# Patient Record
Sex: Female | Born: 1940 | Race: White | Hispanic: No | Smoking: Former smoker
Health system: Southern US, Community
[De-identification: ages and names within clinical notes are randomized; demographics above are authoritative.]

## PROBLEM LIST (undated history)

## (undated) DIAGNOSIS — I209 Angina pectoris, unspecified: Secondary | ICD-10-CM

## (undated) DIAGNOSIS — R0600 Dyspnea, unspecified: Secondary | ICD-10-CM

## (undated) DIAGNOSIS — T4145XA Adverse effect of unspecified anesthetic, initial encounter: Secondary | ICD-10-CM

## (undated) DIAGNOSIS — J449 Chronic obstructive pulmonary disease, unspecified: Secondary | ICD-10-CM

## (undated) DIAGNOSIS — F32A Depression, unspecified: Secondary | ICD-10-CM

## (undated) DIAGNOSIS — C4490 Unspecified malignant neoplasm of skin, unspecified: Secondary | ICD-10-CM

## (undated) DIAGNOSIS — K219 Gastro-esophageal reflux disease without esophagitis: Secondary | ICD-10-CM

## (undated) DIAGNOSIS — E785 Hyperlipidemia, unspecified: Secondary | ICD-10-CM

## (undated) DIAGNOSIS — M1990 Unspecified osteoarthritis, unspecified site: Secondary | ICD-10-CM

## (undated) DIAGNOSIS — I10 Essential (primary) hypertension: Secondary | ICD-10-CM

## (undated) DIAGNOSIS — D649 Anemia, unspecified: Secondary | ICD-10-CM

## (undated) DIAGNOSIS — R519 Headache, unspecified: Secondary | ICD-10-CM

## (undated) DIAGNOSIS — R51 Headache: Secondary | ICD-10-CM

## (undated) DIAGNOSIS — E278 Other specified disorders of adrenal gland: Secondary | ICD-10-CM

## (undated) DIAGNOSIS — Z9889 Other specified postprocedural states: Secondary | ICD-10-CM

## (undated) DIAGNOSIS — E279 Disorder of adrenal gland, unspecified: Secondary | ICD-10-CM

## (undated) DIAGNOSIS — G4730 Sleep apnea, unspecified: Secondary | ICD-10-CM

## (undated) DIAGNOSIS — F329 Major depressive disorder, single episode, unspecified: Secondary | ICD-10-CM

## (undated) DIAGNOSIS — C801 Malignant (primary) neoplasm, unspecified: Secondary | ICD-10-CM

## (undated) DIAGNOSIS — R112 Nausea with vomiting, unspecified: Secondary | ICD-10-CM

## (undated) DIAGNOSIS — T8859XA Other complications of anesthesia, initial encounter: Secondary | ICD-10-CM

## (undated) HISTORY — PX: ROTATOR CUFF REPAIR: SHX139

## (undated) HISTORY — PX: EYE SURGERY: SHX253

## (undated) HISTORY — PX: FOOT SURGERY: SHX648

## (undated) HISTORY — PX: ADENOIDECTOMY: SUR15

## (undated) HISTORY — PX: FRACTURE SURGERY: SHX138

## (undated) HISTORY — PX: TONSILLECTOMY: SUR1361

## (undated) HISTORY — PX: ABDOMINAL HYSTERECTOMY: SHX81

---
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
## 2005-04-20 ENCOUNTER — Ambulatory Visit: Payer: Self-pay

## 2006-06-29 ENCOUNTER — Ambulatory Visit: Payer: Self-pay | Admitting: Unknown Physician Specialty

## 2006-07-30 ENCOUNTER — Ambulatory Visit: Payer: Self-pay | Admitting: Internal Medicine

## 2006-08-22 ENCOUNTER — Ambulatory Visit: Payer: Self-pay | Admitting: Unknown Physician Specialty

## 2006-09-18 ENCOUNTER — Other Ambulatory Visit: Payer: Self-pay

## 2006-09-18 ENCOUNTER — Ambulatory Visit: Payer: Self-pay | Admitting: Unknown Physician Specialty

## 2006-09-23 ENCOUNTER — Ambulatory Visit: Payer: Self-pay | Admitting: Unknown Physician Specialty

## 2007-04-12 ENCOUNTER — Ambulatory Visit: Payer: Self-pay | Admitting: Internal Medicine

## 2008-03-23 ENCOUNTER — Ambulatory Visit: Payer: Self-pay | Admitting: Internal Medicine

## 2008-07-06 ENCOUNTER — Ambulatory Visit: Payer: Self-pay | Admitting: Internal Medicine

## 2009-01-17 ENCOUNTER — Ambulatory Visit: Payer: Self-pay | Admitting: Internal Medicine

## 2009-01-18 ENCOUNTER — Ambulatory Visit: Payer: Self-pay | Admitting: Internal Medicine

## 2009-02-18 ENCOUNTER — Ambulatory Visit: Payer: Self-pay | Admitting: Internal Medicine

## 2009-02-21 ENCOUNTER — Ambulatory Visit: Payer: Self-pay

## 2009-03-07 ENCOUNTER — Ambulatory Visit: Payer: Self-pay

## 2009-03-21 ENCOUNTER — Ambulatory Visit: Payer: Self-pay | Admitting: Internal Medicine

## 2009-05-28 ENCOUNTER — Ambulatory Visit: Payer: Self-pay | Admitting: Internal Medicine

## 2009-10-04 ENCOUNTER — Ambulatory Visit: Payer: Self-pay | Admitting: Podiatry

## 2010-12-18 ENCOUNTER — Ambulatory Visit: Payer: Self-pay | Admitting: Internal Medicine

## 2011-10-20 ENCOUNTER — Ambulatory Visit: Payer: Self-pay | Admitting: Unknown Physician Specialty

## 2011-10-21 LAB — PATHOLOGY REPORT

## 2012-01-01 ENCOUNTER — Ambulatory Visit: Payer: Self-pay | Admitting: Internal Medicine

## 2012-01-13 ENCOUNTER — Ambulatory Visit: Payer: Self-pay | Admitting: Internal Medicine

## 2012-04-01 ENCOUNTER — Ambulatory Visit: Payer: Self-pay | Admitting: Physical Medicine and Rehabilitation

## 2012-04-28 ENCOUNTER — Ambulatory Visit: Payer: Self-pay | Admitting: Unknown Physician Specialty

## 2012-05-27 ENCOUNTER — Ambulatory Visit: Payer: Self-pay | Admitting: Unknown Physician Specialty

## 2012-05-31 LAB — PATHOLOGY REPORT

## 2012-09-27 ENCOUNTER — Ambulatory Visit: Payer: Self-pay

## 2012-09-27 LAB — CREATININE, SERUM
Creatinine: 0.97 mg/dL (ref 0.60–1.30)
EGFR (African American): >60

## 2013-01-14 ENCOUNTER — Encounter: Payer: Self-pay | Admitting: Neurosurgery

## 2013-02-17 ENCOUNTER — Ambulatory Visit: Payer: Self-pay | Admitting: Internal Medicine

## 2013-11-02 ENCOUNTER — Ambulatory Visit: Payer: Self-pay

## 2014-11-28 ENCOUNTER — Other Ambulatory Visit: Payer: Self-pay | Admitting: Internal Medicine

## 2014-11-28 DIAGNOSIS — E279 Disorder of adrenal gland, unspecified: Principal | ICD-10-CM

## 2014-11-28 DIAGNOSIS — E278 Other specified disorders of adrenal gland: Secondary | ICD-10-CM

## 2014-12-06 ENCOUNTER — Ambulatory Visit
Admission: RE | Admit: 2014-12-06 | Discharge: 2014-12-06 | Disposition: A | Discharge status: Home / Self Care | Payer: PPO | Source: Ambulatory Visit | Attending: Internal Medicine | Admitting: Internal Medicine

## 2014-12-06 DIAGNOSIS — D3501 Benign neoplasm of right adrenal gland: Secondary | ICD-10-CM | POA: Diagnosis not present

## 2014-12-06 DIAGNOSIS — E279 Disorder of adrenal gland, unspecified: Secondary | ICD-10-CM

## 2014-12-06 DIAGNOSIS — E278 Other specified disorders of adrenal gland: Secondary | ICD-10-CM

## 2015-08-22 DIAGNOSIS — X32XXXA Exposure to sunlight, initial encounter: Secondary | ICD-10-CM | POA: Diagnosis not present

## 2015-08-22 DIAGNOSIS — L538 Other specified erythematous conditions: Secondary | ICD-10-CM | POA: Diagnosis not present

## 2015-08-22 DIAGNOSIS — L821 Other seborrheic keratosis: Secondary | ICD-10-CM | POA: Diagnosis not present

## 2015-08-22 DIAGNOSIS — L570 Actinic keratosis: Secondary | ICD-10-CM | POA: Diagnosis not present

## 2015-08-22 DIAGNOSIS — L298 Other pruritus: Secondary | ICD-10-CM | POA: Diagnosis not present

## 2015-08-22 DIAGNOSIS — L820 Inflamed seborrheic keratosis: Secondary | ICD-10-CM | POA: Diagnosis not present

## 2015-08-22 DIAGNOSIS — D485 Neoplasm of uncertain behavior of skin: Secondary | ICD-10-CM | POA: Diagnosis not present

## 2015-08-22 DIAGNOSIS — Z85828 Personal history of other malignant neoplasm of skin: Secondary | ICD-10-CM | POA: Diagnosis not present

## 2015-08-22 DIAGNOSIS — D0439 Carcinoma in situ of skin of other parts of face: Secondary | ICD-10-CM | POA: Diagnosis not present

## 2015-08-31 DIAGNOSIS — R42 Dizziness and giddiness: Secondary | ICD-10-CM | POA: Diagnosis not present

## 2015-08-31 DIAGNOSIS — J310 Chronic rhinitis: Secondary | ICD-10-CM | POA: Diagnosis not present

## 2015-08-31 DIAGNOSIS — G4733 Obstructive sleep apnea (adult) (pediatric): Secondary | ICD-10-CM | POA: Diagnosis not present

## 2015-09-25 DIAGNOSIS — H2513 Age-related nuclear cataract, bilateral: Secondary | ICD-10-CM | POA: Diagnosis not present

## 2015-09-26 DIAGNOSIS — G4733 Obstructive sleep apnea (adult) (pediatric): Secondary | ICD-10-CM | POA: Diagnosis not present

## 2015-09-28 DIAGNOSIS — N8111 Cystocele, midline: Secondary | ICD-10-CM | POA: Diagnosis not present

## 2015-09-28 DIAGNOSIS — N3281 Overactive bladder: Secondary | ICD-10-CM | POA: Diagnosis not present

## 2015-10-27 DIAGNOSIS — G4733 Obstructive sleep apnea (adult) (pediatric): Secondary | ICD-10-CM | POA: Diagnosis not present

## 2015-10-31 DIAGNOSIS — G4733 Obstructive sleep apnea (adult) (pediatric): Secondary | ICD-10-CM | POA: Diagnosis not present

## 2015-11-07 DIAGNOSIS — M5136 Other intervertebral disc degeneration, lumbar region: Secondary | ICD-10-CM | POA: Diagnosis not present

## 2015-11-07 DIAGNOSIS — M5134 Other intervertebral disc degeneration, thoracic region: Secondary | ICD-10-CM | POA: Diagnosis not present

## 2015-11-07 DIAGNOSIS — M546 Pain in thoracic spine: Secondary | ICD-10-CM | POA: Diagnosis not present

## 2015-11-07 DIAGNOSIS — M542 Cervicalgia: Secondary | ICD-10-CM | POA: Diagnosis not present

## 2015-11-07 DIAGNOSIS — M9901 Segmental and somatic dysfunction of cervical region: Secondary | ICD-10-CM | POA: Diagnosis not present

## 2015-11-07 DIAGNOSIS — M5030 Other cervical disc degeneration, unspecified cervical region: Secondary | ICD-10-CM | POA: Diagnosis not present

## 2015-11-07 DIAGNOSIS — M545 Low back pain: Secondary | ICD-10-CM | POA: Diagnosis not present

## 2015-11-07 DIAGNOSIS — M9902 Segmental and somatic dysfunction of thoracic region: Secondary | ICD-10-CM | POA: Diagnosis not present

## 2015-11-07 DIAGNOSIS — M9903 Segmental and somatic dysfunction of lumbar region: Secondary | ICD-10-CM | POA: Diagnosis not present

## 2015-11-09 DIAGNOSIS — M5134 Other intervertebral disc degeneration, thoracic region: Secondary | ICD-10-CM | POA: Diagnosis not present

## 2015-11-09 DIAGNOSIS — M5030 Other cervical disc degeneration, unspecified cervical region: Secondary | ICD-10-CM | POA: Diagnosis not present

## 2015-11-09 DIAGNOSIS — M5136 Other intervertebral disc degeneration, lumbar region: Secondary | ICD-10-CM | POA: Diagnosis not present

## 2015-11-09 DIAGNOSIS — M546 Pain in thoracic spine: Secondary | ICD-10-CM | POA: Diagnosis not present

## 2015-11-09 DIAGNOSIS — M9903 Segmental and somatic dysfunction of lumbar region: Secondary | ICD-10-CM | POA: Diagnosis not present

## 2015-11-09 DIAGNOSIS — M9901 Segmental and somatic dysfunction of cervical region: Secondary | ICD-10-CM | POA: Diagnosis not present

## 2015-11-09 DIAGNOSIS — M545 Low back pain: Secondary | ICD-10-CM | POA: Diagnosis not present

## 2015-11-09 DIAGNOSIS — M542 Cervicalgia: Secondary | ICD-10-CM | POA: Diagnosis not present

## 2015-11-09 DIAGNOSIS — M9902 Segmental and somatic dysfunction of thoracic region: Secondary | ICD-10-CM | POA: Diagnosis not present

## 2015-11-12 DIAGNOSIS — M9901 Segmental and somatic dysfunction of cervical region: Secondary | ICD-10-CM | POA: Diagnosis not present

## 2015-11-12 DIAGNOSIS — M545 Low back pain: Secondary | ICD-10-CM | POA: Diagnosis not present

## 2015-11-12 DIAGNOSIS — M9903 Segmental and somatic dysfunction of lumbar region: Secondary | ICD-10-CM | POA: Diagnosis not present

## 2015-11-12 DIAGNOSIS — M5030 Other cervical disc degeneration, unspecified cervical region: Secondary | ICD-10-CM | POA: Diagnosis not present

## 2015-11-12 DIAGNOSIS — M5134 Other intervertebral disc degeneration, thoracic region: Secondary | ICD-10-CM | POA: Diagnosis not present

## 2015-11-12 DIAGNOSIS — M9902 Segmental and somatic dysfunction of thoracic region: Secondary | ICD-10-CM | POA: Diagnosis not present

## 2015-11-12 DIAGNOSIS — M546 Pain in thoracic spine: Secondary | ICD-10-CM | POA: Diagnosis not present

## 2015-11-12 DIAGNOSIS — M542 Cervicalgia: Secondary | ICD-10-CM | POA: Diagnosis not present

## 2015-11-12 DIAGNOSIS — M5136 Other intervertebral disc degeneration, lumbar region: Secondary | ICD-10-CM | POA: Diagnosis not present

## 2015-11-14 DIAGNOSIS — M5134 Other intervertebral disc degeneration, thoracic region: Secondary | ICD-10-CM | POA: Diagnosis not present

## 2015-11-14 DIAGNOSIS — M545 Low back pain: Secondary | ICD-10-CM | POA: Diagnosis not present

## 2015-11-14 DIAGNOSIS — M9903 Segmental and somatic dysfunction of lumbar region: Secondary | ICD-10-CM | POA: Diagnosis not present

## 2015-11-14 DIAGNOSIS — M5030 Other cervical disc degeneration, unspecified cervical region: Secondary | ICD-10-CM | POA: Diagnosis not present

## 2015-11-14 DIAGNOSIS — M9901 Segmental and somatic dysfunction of cervical region: Secondary | ICD-10-CM | POA: Diagnosis not present

## 2015-11-14 DIAGNOSIS — M546 Pain in thoracic spine: Secondary | ICD-10-CM | POA: Diagnosis not present

## 2015-11-14 DIAGNOSIS — I10 Essential (primary) hypertension: Secondary | ICD-10-CM | POA: Diagnosis not present

## 2015-11-14 DIAGNOSIS — M542 Cervicalgia: Secondary | ICD-10-CM | POA: Diagnosis not present

## 2015-11-14 DIAGNOSIS — F329 Major depressive disorder, single episode, unspecified: Secondary | ICD-10-CM | POA: Diagnosis not present

## 2015-11-14 DIAGNOSIS — M9902 Segmental and somatic dysfunction of thoracic region: Secondary | ICD-10-CM | POA: Diagnosis not present

## 2015-11-14 DIAGNOSIS — M5136 Other intervertebral disc degeneration, lumbar region: Secondary | ICD-10-CM | POA: Diagnosis not present

## 2015-11-14 DIAGNOSIS — K219 Gastro-esophageal reflux disease without esophagitis: Secondary | ICD-10-CM | POA: Diagnosis not present

## 2015-11-14 DIAGNOSIS — M79644 Pain in right finger(s): Secondary | ICD-10-CM | POA: Diagnosis not present

## 2015-11-14 DIAGNOSIS — G4733 Obstructive sleep apnea (adult) (pediatric): Secondary | ICD-10-CM | POA: Diagnosis not present

## 2015-11-14 DIAGNOSIS — M159 Polyosteoarthritis, unspecified: Secondary | ICD-10-CM | POA: Diagnosis not present

## 2015-11-15 DIAGNOSIS — G4733 Obstructive sleep apnea (adult) (pediatric): Secondary | ICD-10-CM | POA: Diagnosis not present

## 2015-11-15 DIAGNOSIS — K219 Gastro-esophageal reflux disease without esophagitis: Secondary | ICD-10-CM | POA: Diagnosis not present

## 2015-11-15 DIAGNOSIS — M159 Polyosteoarthritis, unspecified: Secondary | ICD-10-CM | POA: Diagnosis not present

## 2015-11-15 DIAGNOSIS — F329 Major depressive disorder, single episode, unspecified: Secondary | ICD-10-CM | POA: Diagnosis not present

## 2015-11-15 DIAGNOSIS — I10 Essential (primary) hypertension: Secondary | ICD-10-CM | POA: Diagnosis not present

## 2015-11-15 DIAGNOSIS — M79644 Pain in right finger(s): Secondary | ICD-10-CM | POA: Diagnosis not present

## 2015-11-16 DIAGNOSIS — M5136 Other intervertebral disc degeneration, lumbar region: Secondary | ICD-10-CM | POA: Diagnosis not present

## 2015-11-16 DIAGNOSIS — M9903 Segmental and somatic dysfunction of lumbar region: Secondary | ICD-10-CM | POA: Diagnosis not present

## 2015-11-16 DIAGNOSIS — M5030 Other cervical disc degeneration, unspecified cervical region: Secondary | ICD-10-CM | POA: Diagnosis not present

## 2015-11-16 DIAGNOSIS — M545 Low back pain: Secondary | ICD-10-CM | POA: Diagnosis not present

## 2015-11-16 DIAGNOSIS — M546 Pain in thoracic spine: Secondary | ICD-10-CM | POA: Diagnosis not present

## 2015-11-16 DIAGNOSIS — M5134 Other intervertebral disc degeneration, thoracic region: Secondary | ICD-10-CM | POA: Diagnosis not present

## 2015-11-16 DIAGNOSIS — M542 Cervicalgia: Secondary | ICD-10-CM | POA: Diagnosis not present

## 2015-11-16 DIAGNOSIS — M9901 Segmental and somatic dysfunction of cervical region: Secondary | ICD-10-CM | POA: Diagnosis not present

## 2015-11-16 DIAGNOSIS — M9902 Segmental and somatic dysfunction of thoracic region: Secondary | ICD-10-CM | POA: Diagnosis not present

## 2015-11-19 DIAGNOSIS — M542 Cervicalgia: Secondary | ICD-10-CM | POA: Diagnosis not present

## 2015-11-19 DIAGNOSIS — M5136 Other intervertebral disc degeneration, lumbar region: Secondary | ICD-10-CM | POA: Diagnosis not present

## 2015-11-19 DIAGNOSIS — M545 Low back pain: Secondary | ICD-10-CM | POA: Diagnosis not present

## 2015-11-19 DIAGNOSIS — M9902 Segmental and somatic dysfunction of thoracic region: Secondary | ICD-10-CM | POA: Diagnosis not present

## 2015-11-19 DIAGNOSIS — M5030 Other cervical disc degeneration, unspecified cervical region: Secondary | ICD-10-CM | POA: Diagnosis not present

## 2015-11-19 DIAGNOSIS — M546 Pain in thoracic spine: Secondary | ICD-10-CM | POA: Diagnosis not present

## 2015-11-19 DIAGNOSIS — M9901 Segmental and somatic dysfunction of cervical region: Secondary | ICD-10-CM | POA: Diagnosis not present

## 2015-11-19 DIAGNOSIS — M9903 Segmental and somatic dysfunction of lumbar region: Secondary | ICD-10-CM | POA: Diagnosis not present

## 2015-11-19 DIAGNOSIS — M5134 Other intervertebral disc degeneration, thoracic region: Secondary | ICD-10-CM | POA: Diagnosis not present

## 2015-11-20 DIAGNOSIS — G4733 Obstructive sleep apnea (adult) (pediatric): Secondary | ICD-10-CM | POA: Diagnosis not present

## 2015-11-20 DIAGNOSIS — J310 Chronic rhinitis: Secondary | ICD-10-CM | POA: Diagnosis not present

## 2015-11-21 ENCOUNTER — Other Ambulatory Visit: Payer: Self-pay | Admitting: Internal Medicine

## 2015-11-21 DIAGNOSIS — M9901 Segmental and somatic dysfunction of cervical region: Secondary | ICD-10-CM | POA: Diagnosis not present

## 2015-11-21 DIAGNOSIS — M546 Pain in thoracic spine: Secondary | ICD-10-CM | POA: Diagnosis not present

## 2015-11-21 DIAGNOSIS — Z1239 Encounter for other screening for malignant neoplasm of breast: Secondary | ICD-10-CM | POA: Diagnosis not present

## 2015-11-21 DIAGNOSIS — Z87898 Personal history of other specified conditions: Secondary | ICD-10-CM | POA: Diagnosis not present

## 2015-11-21 DIAGNOSIS — I10 Essential (primary) hypertension: Secondary | ICD-10-CM | POA: Diagnosis not present

## 2015-11-21 DIAGNOSIS — Z1231 Encounter for screening mammogram for malignant neoplasm of breast: Secondary | ICD-10-CM

## 2015-11-21 DIAGNOSIS — F329 Major depressive disorder, single episode, unspecified: Secondary | ICD-10-CM | POA: Diagnosis not present

## 2015-11-21 DIAGNOSIS — K219 Gastro-esophageal reflux disease without esophagitis: Secondary | ICD-10-CM | POA: Diagnosis not present

## 2015-11-21 DIAGNOSIS — M5134 Other intervertebral disc degeneration, thoracic region: Secondary | ICD-10-CM | POA: Diagnosis not present

## 2015-11-21 DIAGNOSIS — M9903 Segmental and somatic dysfunction of lumbar region: Secondary | ICD-10-CM | POA: Diagnosis not present

## 2015-11-21 DIAGNOSIS — M545 Low back pain: Secondary | ICD-10-CM | POA: Diagnosis not present

## 2015-11-21 DIAGNOSIS — M5136 Other intervertebral disc degeneration, lumbar region: Secondary | ICD-10-CM | POA: Diagnosis not present

## 2015-11-21 DIAGNOSIS — M542 Cervicalgia: Secondary | ICD-10-CM | POA: Diagnosis not present

## 2015-11-21 DIAGNOSIS — M9902 Segmental and somatic dysfunction of thoracic region: Secondary | ICD-10-CM | POA: Diagnosis not present

## 2015-11-21 DIAGNOSIS — M5030 Other cervical disc degeneration, unspecified cervical region: Secondary | ICD-10-CM | POA: Diagnosis not present

## 2015-11-23 DIAGNOSIS — M545 Low back pain: Secondary | ICD-10-CM | POA: Diagnosis not present

## 2015-11-23 DIAGNOSIS — M5134 Other intervertebral disc degeneration, thoracic region: Secondary | ICD-10-CM | POA: Diagnosis not present

## 2015-11-23 DIAGNOSIS — M5136 Other intervertebral disc degeneration, lumbar region: Secondary | ICD-10-CM | POA: Diagnosis not present

## 2015-11-23 DIAGNOSIS — M9903 Segmental and somatic dysfunction of lumbar region: Secondary | ICD-10-CM | POA: Diagnosis not present

## 2015-11-23 DIAGNOSIS — M546 Pain in thoracic spine: Secondary | ICD-10-CM | POA: Diagnosis not present

## 2015-11-23 DIAGNOSIS — M9901 Segmental and somatic dysfunction of cervical region: Secondary | ICD-10-CM | POA: Diagnosis not present

## 2015-11-23 DIAGNOSIS — M542 Cervicalgia: Secondary | ICD-10-CM | POA: Diagnosis not present

## 2015-11-23 DIAGNOSIS — M9902 Segmental and somatic dysfunction of thoracic region: Secondary | ICD-10-CM | POA: Diagnosis not present

## 2015-11-23 DIAGNOSIS — M5030 Other cervical disc degeneration, unspecified cervical region: Secondary | ICD-10-CM | POA: Diagnosis not present

## 2015-11-26 DIAGNOSIS — G4733 Obstructive sleep apnea (adult) (pediatric): Secondary | ICD-10-CM | POA: Diagnosis not present

## 2015-11-29 ENCOUNTER — Ambulatory Visit: Payer: PPO

## 2015-12-10 DIAGNOSIS — M546 Pain in thoracic spine: Secondary | ICD-10-CM | POA: Diagnosis not present

## 2015-12-10 DIAGNOSIS — M542 Cervicalgia: Secondary | ICD-10-CM | POA: Diagnosis not present

## 2015-12-10 DIAGNOSIS — M5136 Other intervertebral disc degeneration, lumbar region: Secondary | ICD-10-CM | POA: Diagnosis not present

## 2015-12-10 DIAGNOSIS — M5030 Other cervical disc degeneration, unspecified cervical region: Secondary | ICD-10-CM | POA: Diagnosis not present

## 2015-12-10 DIAGNOSIS — M545 Low back pain: Secondary | ICD-10-CM | POA: Diagnosis not present

## 2015-12-10 DIAGNOSIS — M9902 Segmental and somatic dysfunction of thoracic region: Secondary | ICD-10-CM | POA: Diagnosis not present

## 2015-12-10 DIAGNOSIS — M5134 Other intervertebral disc degeneration, thoracic region: Secondary | ICD-10-CM | POA: Diagnosis not present

## 2015-12-10 DIAGNOSIS — M9901 Segmental and somatic dysfunction of cervical region: Secondary | ICD-10-CM | POA: Diagnosis not present

## 2015-12-10 DIAGNOSIS — M9903 Segmental and somatic dysfunction of lumbar region: Secondary | ICD-10-CM | POA: Diagnosis not present

## 2015-12-12 DIAGNOSIS — M5030 Other cervical disc degeneration, unspecified cervical region: Secondary | ICD-10-CM | POA: Diagnosis not present

## 2015-12-12 DIAGNOSIS — M546 Pain in thoracic spine: Secondary | ICD-10-CM | POA: Diagnosis not present

## 2015-12-12 DIAGNOSIS — M9901 Segmental and somatic dysfunction of cervical region: Secondary | ICD-10-CM | POA: Diagnosis not present

## 2015-12-12 DIAGNOSIS — M542 Cervicalgia: Secondary | ICD-10-CM | POA: Diagnosis not present

## 2015-12-12 DIAGNOSIS — M9902 Segmental and somatic dysfunction of thoracic region: Secondary | ICD-10-CM | POA: Diagnosis not present

## 2015-12-12 DIAGNOSIS — M5134 Other intervertebral disc degeneration, thoracic region: Secondary | ICD-10-CM | POA: Diagnosis not present

## 2015-12-12 DIAGNOSIS — M545 Low back pain: Secondary | ICD-10-CM | POA: Diagnosis not present

## 2015-12-12 DIAGNOSIS — M9903 Segmental and somatic dysfunction of lumbar region: Secondary | ICD-10-CM | POA: Diagnosis not present

## 2015-12-12 DIAGNOSIS — M5136 Other intervertebral disc degeneration, lumbar region: Secondary | ICD-10-CM | POA: Diagnosis not present

## 2015-12-14 DIAGNOSIS — M9902 Segmental and somatic dysfunction of thoracic region: Secondary | ICD-10-CM | POA: Diagnosis not present

## 2015-12-14 DIAGNOSIS — M5134 Other intervertebral disc degeneration, thoracic region: Secondary | ICD-10-CM | POA: Diagnosis not present

## 2015-12-14 DIAGNOSIS — M9901 Segmental and somatic dysfunction of cervical region: Secondary | ICD-10-CM | POA: Diagnosis not present

## 2015-12-14 DIAGNOSIS — M9903 Segmental and somatic dysfunction of lumbar region: Secondary | ICD-10-CM | POA: Diagnosis not present

## 2015-12-14 DIAGNOSIS — M542 Cervicalgia: Secondary | ICD-10-CM | POA: Diagnosis not present

## 2015-12-14 DIAGNOSIS — M5030 Other cervical disc degeneration, unspecified cervical region: Secondary | ICD-10-CM | POA: Diagnosis not present

## 2015-12-14 DIAGNOSIS — M5136 Other intervertebral disc degeneration, lumbar region: Secondary | ICD-10-CM | POA: Diagnosis not present

## 2015-12-14 DIAGNOSIS — M545 Low back pain: Secondary | ICD-10-CM | POA: Diagnosis not present

## 2015-12-14 DIAGNOSIS — M546 Pain in thoracic spine: Secondary | ICD-10-CM | POA: Diagnosis not present

## 2015-12-18 ENCOUNTER — Ambulatory Visit: Admission: RE | Admit: 2015-12-18 | Payer: PPO | Source: Ambulatory Visit

## 2015-12-19 DIAGNOSIS — M9901 Segmental and somatic dysfunction of cervical region: Secondary | ICD-10-CM | POA: Diagnosis not present

## 2015-12-19 DIAGNOSIS — M542 Cervicalgia: Secondary | ICD-10-CM | POA: Diagnosis not present

## 2015-12-19 DIAGNOSIS — M9903 Segmental and somatic dysfunction of lumbar region: Secondary | ICD-10-CM | POA: Diagnosis not present

## 2015-12-19 DIAGNOSIS — M9902 Segmental and somatic dysfunction of thoracic region: Secondary | ICD-10-CM | POA: Diagnosis not present

## 2015-12-19 DIAGNOSIS — M5134 Other intervertebral disc degeneration, thoracic region: Secondary | ICD-10-CM | POA: Diagnosis not present

## 2015-12-19 DIAGNOSIS — M546 Pain in thoracic spine: Secondary | ICD-10-CM | POA: Diagnosis not present

## 2015-12-19 DIAGNOSIS — M5030 Other cervical disc degeneration, unspecified cervical region: Secondary | ICD-10-CM | POA: Diagnosis not present

## 2015-12-19 DIAGNOSIS — M5136 Other intervertebral disc degeneration, lumbar region: Secondary | ICD-10-CM | POA: Diagnosis not present

## 2015-12-19 DIAGNOSIS — M545 Low back pain: Secondary | ICD-10-CM | POA: Diagnosis not present

## 2015-12-20 ENCOUNTER — Ambulatory Visit
Admission: RE | Admit: 2015-12-20 | Discharge: 2015-12-20 | Disposition: A | Discharge status: Home / Self Care | Payer: PPO | Source: Ambulatory Visit | Attending: Internal Medicine | Admitting: Internal Medicine

## 2015-12-20 DIAGNOSIS — Z1231 Encounter for screening mammogram for malignant neoplasm of breast: Secondary | ICD-10-CM

## 2015-12-20 HISTORY — DX: Unspecified malignant neoplasm of skin, unspecified: C44.90

## 2015-12-20 HISTORY — DX: Malignant (primary) neoplasm, unspecified: C80.1

## 2015-12-24 ENCOUNTER — Other Ambulatory Visit: Payer: Self-pay | Admitting: Internal Medicine

## 2015-12-24 DIAGNOSIS — R928 Other abnormal and inconclusive findings on diagnostic imaging of breast: Secondary | ICD-10-CM

## 2015-12-25 DIAGNOSIS — M9901 Segmental and somatic dysfunction of cervical region: Secondary | ICD-10-CM | POA: Diagnosis not present

## 2015-12-25 DIAGNOSIS — M546 Pain in thoracic spine: Secondary | ICD-10-CM | POA: Diagnosis not present

## 2015-12-25 DIAGNOSIS — M9902 Segmental and somatic dysfunction of thoracic region: Secondary | ICD-10-CM | POA: Diagnosis not present

## 2015-12-25 DIAGNOSIS — M542 Cervicalgia: Secondary | ICD-10-CM | POA: Diagnosis not present

## 2015-12-25 DIAGNOSIS — M5136 Other intervertebral disc degeneration, lumbar region: Secondary | ICD-10-CM | POA: Diagnosis not present

## 2015-12-25 DIAGNOSIS — M545 Low back pain: Secondary | ICD-10-CM | POA: Diagnosis not present

## 2015-12-25 DIAGNOSIS — M5134 Other intervertebral disc degeneration, thoracic region: Secondary | ICD-10-CM | POA: Diagnosis not present

## 2015-12-25 DIAGNOSIS — M9903 Segmental and somatic dysfunction of lumbar region: Secondary | ICD-10-CM | POA: Diagnosis not present

## 2015-12-25 DIAGNOSIS — M5030 Other cervical disc degeneration, unspecified cervical region: Secondary | ICD-10-CM | POA: Diagnosis not present

## 2015-12-27 DIAGNOSIS — G4733 Obstructive sleep apnea (adult) (pediatric): Secondary | ICD-10-CM | POA: Diagnosis not present

## 2015-12-28 ENCOUNTER — Ambulatory Visit
Admission: RE | Admit: 2015-12-28 | Discharge: 2015-12-28 | Disposition: A | Discharge status: Home / Self Care | Payer: PPO | Source: Ambulatory Visit | Attending: Internal Medicine | Admitting: Internal Medicine

## 2015-12-28 DIAGNOSIS — R928 Other abnormal and inconclusive findings on diagnostic imaging of breast: Secondary | ICD-10-CM

## 2015-12-28 DIAGNOSIS — M9903 Segmental and somatic dysfunction of lumbar region: Secondary | ICD-10-CM | POA: Diagnosis not present

## 2015-12-28 DIAGNOSIS — M545 Low back pain: Secondary | ICD-10-CM | POA: Diagnosis not present

## 2015-12-28 DIAGNOSIS — M5136 Other intervertebral disc degeneration, lumbar region: Secondary | ICD-10-CM | POA: Diagnosis not present

## 2015-12-28 DIAGNOSIS — R921 Mammographic calcification found on diagnostic imaging of breast: Secondary | ICD-10-CM | POA: Insufficient documentation

## 2015-12-28 DIAGNOSIS — M542 Cervicalgia: Secondary | ICD-10-CM | POA: Diagnosis not present

## 2015-12-28 DIAGNOSIS — M9902 Segmental and somatic dysfunction of thoracic region: Secondary | ICD-10-CM | POA: Diagnosis not present

## 2015-12-28 DIAGNOSIS — M9901 Segmental and somatic dysfunction of cervical region: Secondary | ICD-10-CM | POA: Diagnosis not present

## 2015-12-28 DIAGNOSIS — M5030 Other cervical disc degeneration, unspecified cervical region: Secondary | ICD-10-CM | POA: Diagnosis not present

## 2015-12-28 DIAGNOSIS — M5134 Other intervertebral disc degeneration, thoracic region: Secondary | ICD-10-CM | POA: Diagnosis not present

## 2015-12-28 DIAGNOSIS — M546 Pain in thoracic spine: Secondary | ICD-10-CM | POA: Diagnosis not present

## 2015-12-31 DIAGNOSIS — M546 Pain in thoracic spine: Secondary | ICD-10-CM | POA: Diagnosis not present

## 2015-12-31 DIAGNOSIS — M5134 Other intervertebral disc degeneration, thoracic region: Secondary | ICD-10-CM | POA: Diagnosis not present

## 2015-12-31 DIAGNOSIS — M545 Low back pain: Secondary | ICD-10-CM | POA: Diagnosis not present

## 2015-12-31 DIAGNOSIS — M9902 Segmental and somatic dysfunction of thoracic region: Secondary | ICD-10-CM | POA: Diagnosis not present

## 2015-12-31 DIAGNOSIS — M5030 Other cervical disc degeneration, unspecified cervical region: Secondary | ICD-10-CM | POA: Diagnosis not present

## 2015-12-31 DIAGNOSIS — M542 Cervicalgia: Secondary | ICD-10-CM | POA: Diagnosis not present

## 2015-12-31 DIAGNOSIS — M5136 Other intervertebral disc degeneration, lumbar region: Secondary | ICD-10-CM | POA: Diagnosis not present

## 2015-12-31 DIAGNOSIS — M9903 Segmental and somatic dysfunction of lumbar region: Secondary | ICD-10-CM | POA: Diagnosis not present

## 2015-12-31 DIAGNOSIS — M9901 Segmental and somatic dysfunction of cervical region: Secondary | ICD-10-CM | POA: Diagnosis not present

## 2016-01-01 ENCOUNTER — Other Ambulatory Visit: Payer: Self-pay | Admitting: Internal Medicine

## 2016-01-01 DIAGNOSIS — R921 Mammographic calcification found on diagnostic imaging of breast: Secondary | ICD-10-CM

## 2016-01-03 DIAGNOSIS — M545 Low back pain: Secondary | ICD-10-CM | POA: Diagnosis not present

## 2016-01-03 DIAGNOSIS — M9901 Segmental and somatic dysfunction of cervical region: Secondary | ICD-10-CM | POA: Diagnosis not present

## 2016-01-03 DIAGNOSIS — M546 Pain in thoracic spine: Secondary | ICD-10-CM | POA: Diagnosis not present

## 2016-01-03 DIAGNOSIS — M9902 Segmental and somatic dysfunction of thoracic region: Secondary | ICD-10-CM | POA: Diagnosis not present

## 2016-01-03 DIAGNOSIS — M5030 Other cervical disc degeneration, unspecified cervical region: Secondary | ICD-10-CM | POA: Diagnosis not present

## 2016-01-03 DIAGNOSIS — M542 Cervicalgia: Secondary | ICD-10-CM | POA: Diagnosis not present

## 2016-01-03 DIAGNOSIS — M9903 Segmental and somatic dysfunction of lumbar region: Secondary | ICD-10-CM | POA: Diagnosis not present

## 2016-01-03 DIAGNOSIS — M5134 Other intervertebral disc degeneration, thoracic region: Secondary | ICD-10-CM | POA: Diagnosis not present

## 2016-01-03 DIAGNOSIS — M5136 Other intervertebral disc degeneration, lumbar region: Secondary | ICD-10-CM | POA: Diagnosis not present

## 2016-01-04 ENCOUNTER — Ambulatory Visit
Admission: RE | Admit: 2016-01-04 | Discharge: 2016-01-04 | Disposition: A | Discharge status: Home / Self Care | Payer: PPO | Source: Ambulatory Visit | Attending: Internal Medicine | Admitting: Internal Medicine

## 2016-01-04 DIAGNOSIS — R921 Mammographic calcification found on diagnostic imaging of breast: Secondary | ICD-10-CM

## 2016-01-04 HISTORY — PX: BREAST BIOPSY: SHX20

## 2016-01-07 DIAGNOSIS — G4733 Obstructive sleep apnea (adult) (pediatric): Secondary | ICD-10-CM | POA: Diagnosis not present

## 2016-01-07 LAB — SURGICAL PATHOLOGY

## 2016-01-10 DIAGNOSIS — M9901 Segmental and somatic dysfunction of cervical region: Secondary | ICD-10-CM | POA: Diagnosis not present

## 2016-01-10 DIAGNOSIS — M5134 Other intervertebral disc degeneration, thoracic region: Secondary | ICD-10-CM | POA: Diagnosis not present

## 2016-01-10 DIAGNOSIS — M5136 Other intervertebral disc degeneration, lumbar region: Secondary | ICD-10-CM | POA: Diagnosis not present

## 2016-01-10 DIAGNOSIS — M546 Pain in thoracic spine: Secondary | ICD-10-CM | POA: Diagnosis not present

## 2016-01-10 DIAGNOSIS — M9902 Segmental and somatic dysfunction of thoracic region: Secondary | ICD-10-CM | POA: Diagnosis not present

## 2016-01-10 DIAGNOSIS — M542 Cervicalgia: Secondary | ICD-10-CM | POA: Diagnosis not present

## 2016-01-10 DIAGNOSIS — M9903 Segmental and somatic dysfunction of lumbar region: Secondary | ICD-10-CM | POA: Diagnosis not present

## 2016-01-10 DIAGNOSIS — M545 Low back pain: Secondary | ICD-10-CM | POA: Diagnosis not present

## 2016-01-10 DIAGNOSIS — M5030 Other cervical disc degeneration, unspecified cervical region: Secondary | ICD-10-CM | POA: Diagnosis not present

## 2016-01-17 DIAGNOSIS — M9902 Segmental and somatic dysfunction of thoracic region: Secondary | ICD-10-CM | POA: Diagnosis not present

## 2016-01-17 DIAGNOSIS — M546 Pain in thoracic spine: Secondary | ICD-10-CM | POA: Diagnosis not present

## 2016-01-17 DIAGNOSIS — M5134 Other intervertebral disc degeneration, thoracic region: Secondary | ICD-10-CM | POA: Diagnosis not present

## 2016-01-17 DIAGNOSIS — M9903 Segmental and somatic dysfunction of lumbar region: Secondary | ICD-10-CM | POA: Diagnosis not present

## 2016-01-17 DIAGNOSIS — M542 Cervicalgia: Secondary | ICD-10-CM | POA: Diagnosis not present

## 2016-01-17 DIAGNOSIS — M9901 Segmental and somatic dysfunction of cervical region: Secondary | ICD-10-CM | POA: Diagnosis not present

## 2016-01-17 DIAGNOSIS — M5030 Other cervical disc degeneration, unspecified cervical region: Secondary | ICD-10-CM | POA: Diagnosis not present

## 2016-01-17 DIAGNOSIS — M545 Low back pain: Secondary | ICD-10-CM | POA: Diagnosis not present

## 2016-01-17 DIAGNOSIS — M5136 Other intervertebral disc degeneration, lumbar region: Secondary | ICD-10-CM | POA: Diagnosis not present

## 2016-01-26 DIAGNOSIS — G4733 Obstructive sleep apnea (adult) (pediatric): Secondary | ICD-10-CM | POA: Diagnosis not present

## 2016-02-26 DIAGNOSIS — G4733 Obstructive sleep apnea (adult) (pediatric): Secondary | ICD-10-CM | POA: Diagnosis not present

## 2016-03-28 DIAGNOSIS — G4733 Obstructive sleep apnea (adult) (pediatric): Secondary | ICD-10-CM | POA: Diagnosis not present

## 2016-04-21 DIAGNOSIS — G4733 Obstructive sleep apnea (adult) (pediatric): Secondary | ICD-10-CM | POA: Diagnosis not present

## 2016-04-27 DIAGNOSIS — G4733 Obstructive sleep apnea (adult) (pediatric): Secondary | ICD-10-CM | POA: Diagnosis not present

## 2016-05-02 DIAGNOSIS — G4733 Obstructive sleep apnea (adult) (pediatric): Secondary | ICD-10-CM | POA: Diagnosis not present

## 2016-05-21 DIAGNOSIS — G4733 Obstructive sleep apnea (adult) (pediatric): Secondary | ICD-10-CM | POA: Diagnosis not present

## 2016-05-28 DIAGNOSIS — G4733 Obstructive sleep apnea (adult) (pediatric): Secondary | ICD-10-CM | POA: Diagnosis not present

## 2016-06-27 DIAGNOSIS — F329 Major depressive disorder, single episode, unspecified: Secondary | ICD-10-CM | POA: Diagnosis not present

## 2016-06-27 DIAGNOSIS — Z1231 Encounter for screening mammogram for malignant neoplasm of breast: Secondary | ICD-10-CM | POA: Diagnosis not present

## 2016-06-27 DIAGNOSIS — I10 Essential (primary) hypertension: Secondary | ICD-10-CM | POA: Diagnosis not present

## 2016-06-27 DIAGNOSIS — G4733 Obstructive sleep apnea (adult) (pediatric): Secondary | ICD-10-CM | POA: Diagnosis not present

## 2016-06-27 DIAGNOSIS — K219 Gastro-esophageal reflux disease without esophagitis: Secondary | ICD-10-CM | POA: Diagnosis not present

## 2016-07-04 DIAGNOSIS — R079 Chest pain, unspecified: Secondary | ICD-10-CM | POA: Diagnosis not present

## 2016-07-04 DIAGNOSIS — Z0000 Encounter for general adult medical examination without abnormal findings: Secondary | ICD-10-CM | POA: Diagnosis not present

## 2016-07-04 DIAGNOSIS — R002 Palpitations: Secondary | ICD-10-CM | POA: Diagnosis not present

## 2016-07-04 DIAGNOSIS — K219 Gastro-esophageal reflux disease without esophagitis: Secondary | ICD-10-CM | POA: Diagnosis not present

## 2016-07-04 DIAGNOSIS — R232 Flushing: Secondary | ICD-10-CM | POA: Diagnosis not present

## 2016-07-04 DIAGNOSIS — I10 Essential (primary) hypertension: Secondary | ICD-10-CM | POA: Diagnosis not present

## 2016-07-04 DIAGNOSIS — G4733 Obstructive sleep apnea (adult) (pediatric): Secondary | ICD-10-CM | POA: Diagnosis not present

## 2016-07-22 IMAGING — MG MM DIGITAL SCREENING BILAT W/ CAD
1 series · 4 of 4 positions shown · non-contrast
Comparison: Previous exam(s).

ACR Breast Density Category a: The breast tissue is almost entirely
fatty.

CLINICAL DATA: Screening.

EXAM:
DIGITAL SCREENING BILATERAL MAMMOGRAM WITH CAD

[R CC · right · 4 of 4 slices shown]
[im 1/4]
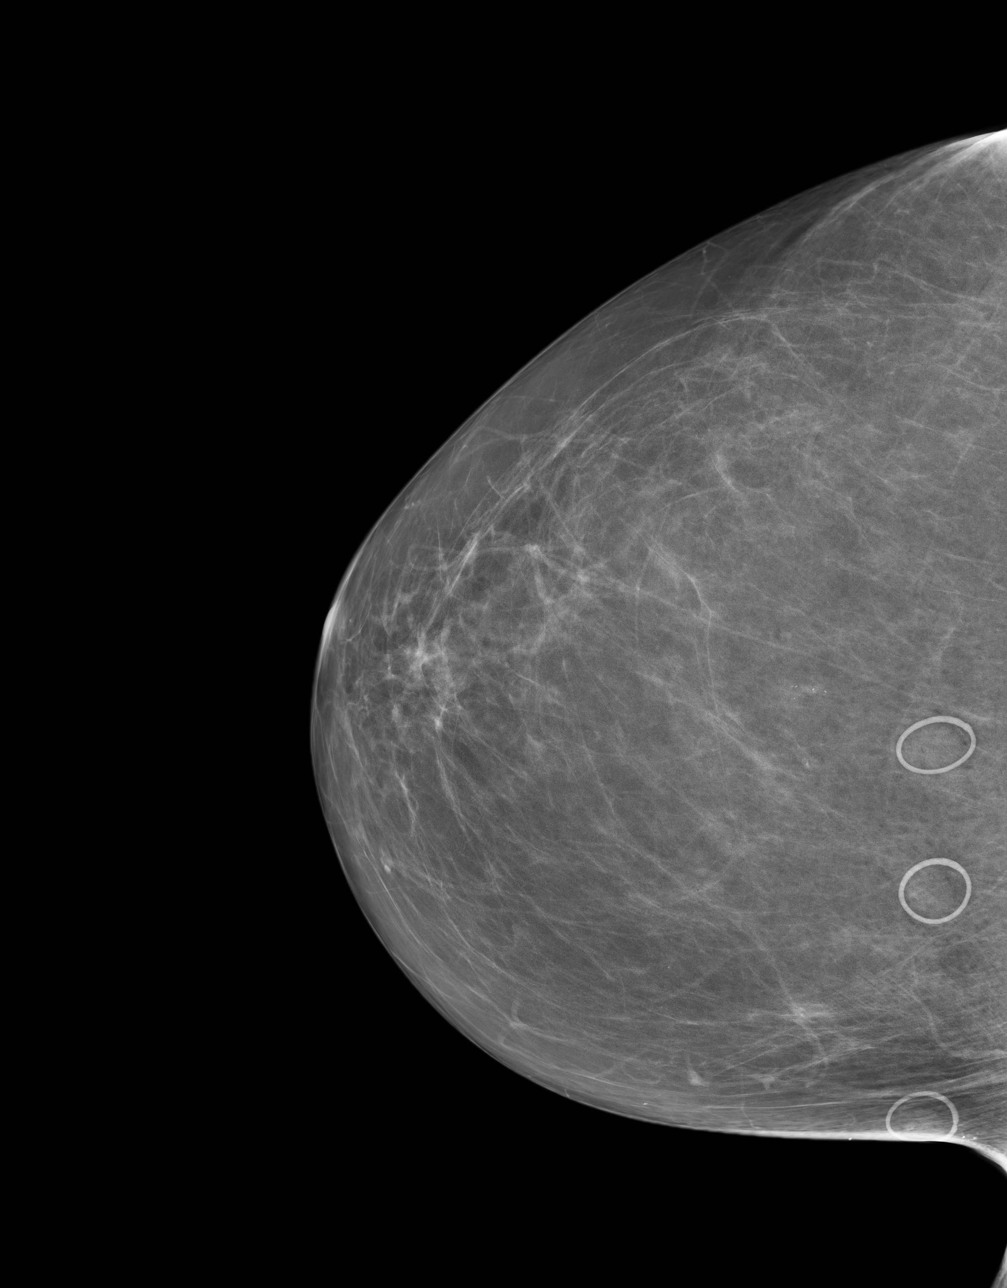
[im 2/4]
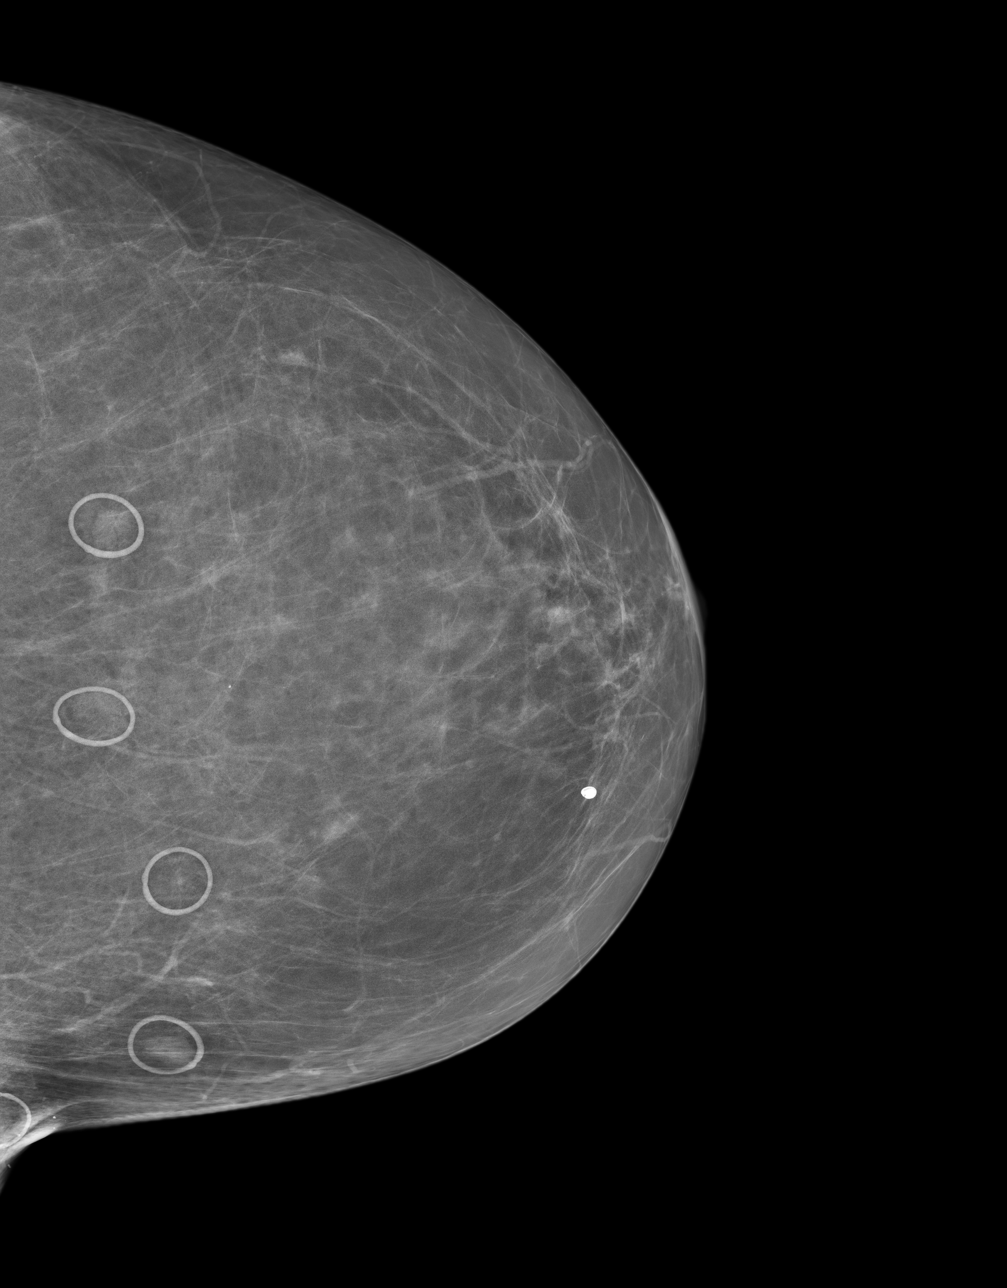
[im 3/4]
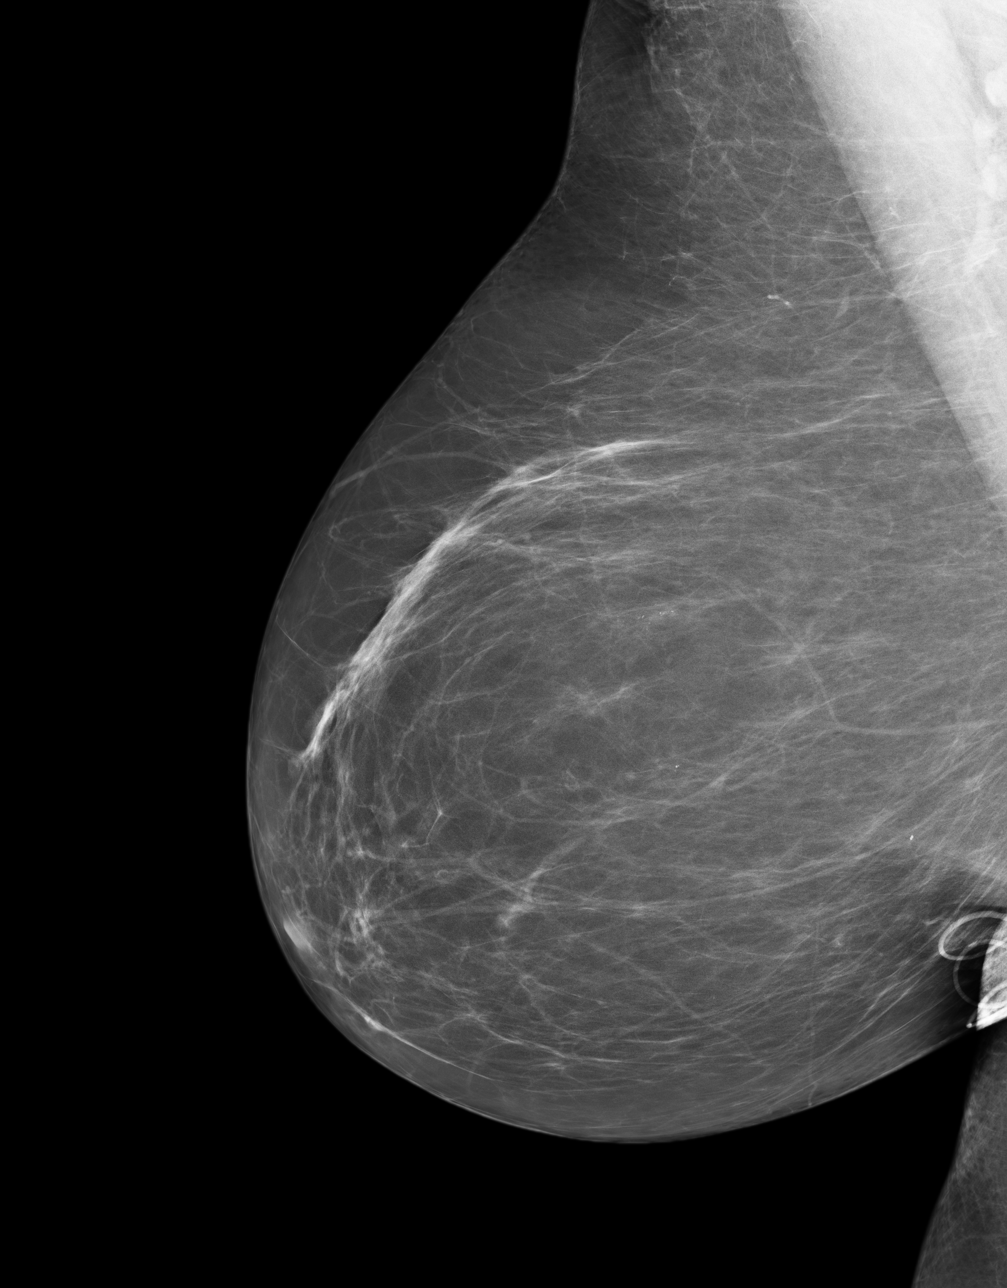
[im 4/4]
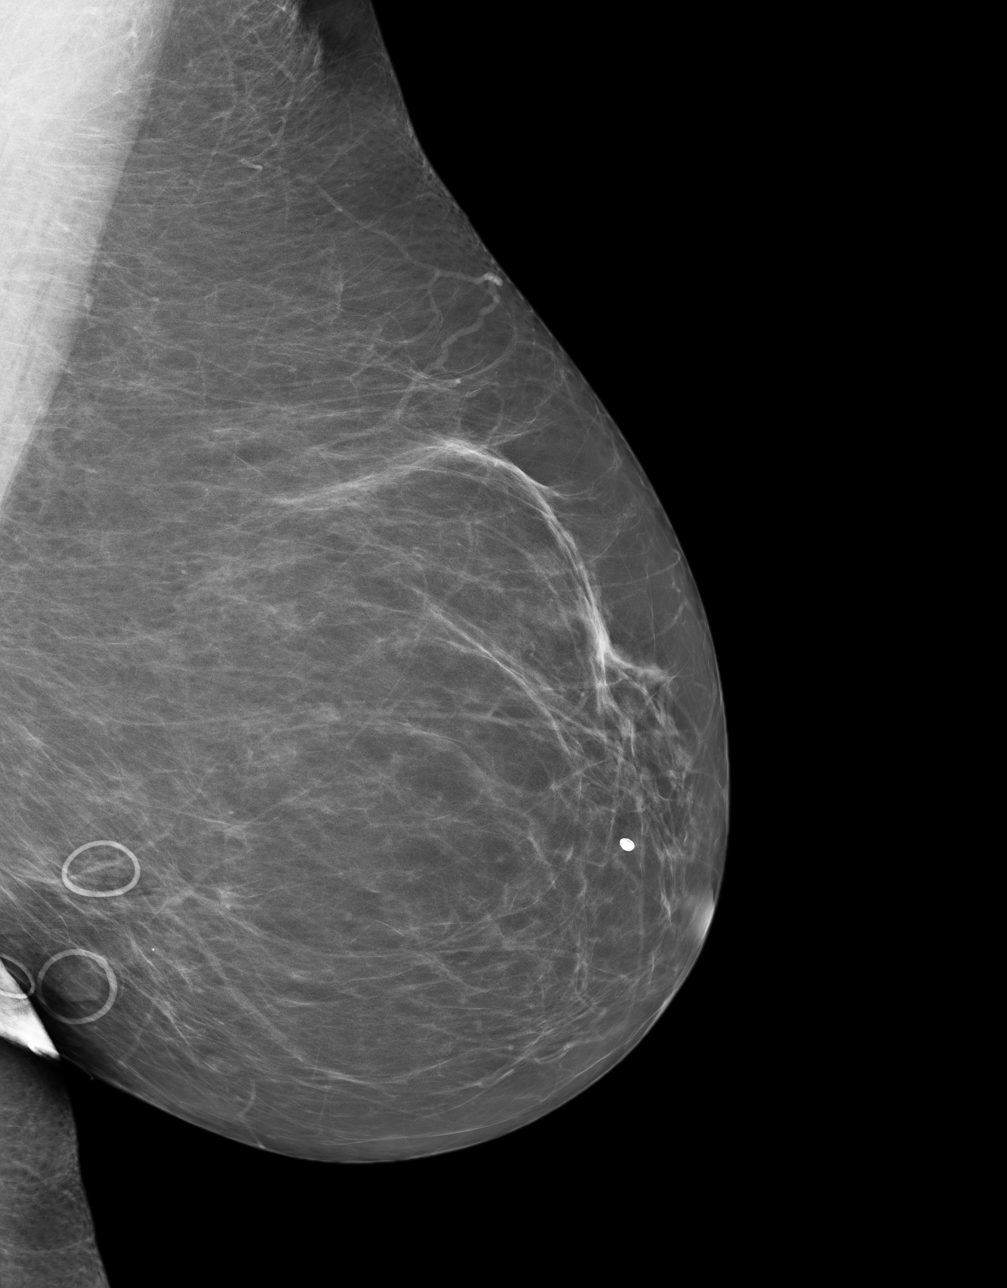

[4 of 4 positions shown; findings below may reference images not displayed]

FINDINGS: In the right breast, calcifications warrant further evaluation with
magnified views. In the left breast, no findings suspicious for
malignancy. Images were processed with CAD.
IMPRESSION: Further evaluation is suggested for calcifications in the right
breast.

RECOMMENDATION:
Diagnostic mammogram of the right breast. (Code:[id number])

The patient will be contacted regarding the findings, and additional
imaging will be scheduled.

BI-RADS CATEGORY  0: Incomplete. Need additional imaging evaluation
and/or prior mammograms for comparison.

## 2016-07-23 DIAGNOSIS — R002 Palpitations: Secondary | ICD-10-CM | POA: Diagnosis not present

## 2016-07-23 DIAGNOSIS — R0789 Other chest pain: Secondary | ICD-10-CM | POA: Diagnosis not present

## 2016-07-23 DIAGNOSIS — I10 Essential (primary) hypertension: Secondary | ICD-10-CM | POA: Diagnosis not present

## 2016-07-23 DIAGNOSIS — R079 Chest pain, unspecified: Secondary | ICD-10-CM | POA: Diagnosis not present

## 2016-07-23 DIAGNOSIS — R0602 Shortness of breath: Secondary | ICD-10-CM | POA: Diagnosis not present

## 2016-07-28 DIAGNOSIS — G4733 Obstructive sleep apnea (adult) (pediatric): Secondary | ICD-10-CM | POA: Diagnosis not present

## 2016-07-29 DIAGNOSIS — R002 Palpitations: Secondary | ICD-10-CM | POA: Diagnosis not present

## 2016-08-11 DIAGNOSIS — R079 Chest pain, unspecified: Secondary | ICD-10-CM | POA: Diagnosis not present

## 2016-08-11 DIAGNOSIS — R0602 Shortness of breath: Secondary | ICD-10-CM | POA: Diagnosis not present

## 2016-08-14 DIAGNOSIS — G4733 Obstructive sleep apnea (adult) (pediatric): Secondary | ICD-10-CM | POA: Diagnosis not present

## 2016-08-14 DIAGNOSIS — I10 Essential (primary) hypertension: Secondary | ICD-10-CM | POA: Diagnosis not present

## 2016-08-14 DIAGNOSIS — R079 Chest pain, unspecified: Secondary | ICD-10-CM | POA: Diagnosis not present

## 2016-08-21 ENCOUNTER — Ambulatory Visit
Admission: RE | Admit: 2016-08-21 | Discharge: 2016-08-21 | Disposition: A | Discharge status: Home / Self Care | Payer: PPO | Source: Ambulatory Visit | Attending: Cardiology | Admitting: Cardiology

## 2016-08-21 ENCOUNTER — Encounter
Admission: RE | Disposition: A | Discharge status: Home / Self Care | Payer: Self-pay | Source: Ambulatory Visit | Attending: Cardiology

## 2016-08-21 DIAGNOSIS — Z79899 Other long term (current) drug therapy: Secondary | ICD-10-CM | POA: Diagnosis not present

## 2016-08-21 DIAGNOSIS — Z87891 Personal history of nicotine dependence: Secondary | ICD-10-CM | POA: Diagnosis not present

## 2016-08-21 DIAGNOSIS — I10 Essential (primary) hypertension: Secondary | ICD-10-CM | POA: Insufficient documentation

## 2016-08-21 DIAGNOSIS — I2510 Atherosclerotic heart disease of native coronary artery without angina pectoris: Secondary | ICD-10-CM | POA: Insufficient documentation

## 2016-08-21 DIAGNOSIS — R0602 Shortness of breath: Secondary | ICD-10-CM | POA: Diagnosis not present

## 2016-08-21 DIAGNOSIS — J449 Chronic obstructive pulmonary disease, unspecified: Secondary | ICD-10-CM | POA: Diagnosis not present

## 2016-08-21 DIAGNOSIS — R42 Dizziness and giddiness: Secondary | ICD-10-CM | POA: Diagnosis not present

## 2016-08-21 DIAGNOSIS — R9430 Abnormal result of cardiovascular function study, unspecified: Secondary | ICD-10-CM | POA: Diagnosis not present

## 2016-08-21 DIAGNOSIS — Z791 Long term (current) use of non-steroidal anti-inflammatories (NSAID): Secondary | ICD-10-CM | POA: Diagnosis not present

## 2016-08-21 DIAGNOSIS — R079 Chest pain, unspecified: Secondary | ICD-10-CM | POA: Insufficient documentation

## 2016-08-21 DIAGNOSIS — F329 Major depressive disorder, single episode, unspecified: Secondary | ICD-10-CM | POA: Insufficient documentation

## 2016-08-21 DIAGNOSIS — K219 Gastro-esophageal reflux disease without esophagitis: Secondary | ICD-10-CM | POA: Insufficient documentation

## 2016-08-21 HISTORY — DX: Sleep apnea, unspecified: G47.30

## 2016-08-21 HISTORY — DX: Angina pectoris, unspecified: I20.9

## 2016-08-21 HISTORY — DX: Unspecified osteoarthritis, unspecified site: M19.90

## 2016-08-21 HISTORY — PX: CARDIAC CATHETERIZATION: SHX172

## 2016-08-21 HISTORY — DX: Chronic obstructive pulmonary disease, unspecified: J44.9

## 2016-08-21 HISTORY — DX: Essential (primary) hypertension: I10

## 2016-08-21 SURGERY — LEFT HEART CATH AND CORONARY ANGIOGRAPHY
Anesthesia: Moderate Sedation | Laterality: Left

## 2016-08-21 MED ORDER — MIDAZOLAM HCL 2 MG/2ML IJ SOLN
INTRAMUSCULAR | Status: DC | PRN
  Administered 2016-08-21: 1 mg via INTRAVENOUS

## 2016-08-21 MED ORDER — ASPIRIN 81 MG PO CHEW
81.0000 mg | CHEWABLE_TABLET | ORAL | Status: AC
  Administered 2016-08-21: 81 mg via ORAL

## 2016-08-21 MED ORDER — MIDAZOLAM HCL 2 MG/2ML IJ SOLN
INTRAMUSCULAR | Status: AC
  Filled 2016-08-21: qty 2

## 2016-08-21 MED ORDER — FENTANYL CITRATE (PF) 100 MCG/2ML IJ SOLN
INTRAMUSCULAR | Status: AC
Start: 2016-08-21 — End: 2016-08-21
  Filled 2016-08-21: qty 2

## 2016-08-21 MED ORDER — SODIUM CHLORIDE 0.9 % WEIGHT BASED INFUSION: 1.0000 mL/kg/h | INTRAVENOUS | Status: DC

## 2016-08-21 MED ORDER — SODIUM CHLORIDE 0.9 % WEIGHT BASED INFUSION
3.0000 mL/kg/h | INTRAVENOUS | Status: DC
  Administered 2016-08-21: 3 mL/kg/h via INTRAVENOUS

## 2016-08-21 MED ORDER — SODIUM CHLORIDE 0.9% FLUSH: 3.0000 mL | Freq: Two times a day (BID) | INTRAVENOUS | Status: DC

## 2016-08-21 MED ORDER — FENTANYL CITRATE (PF) 100 MCG/2ML IJ SOLN
INTRAMUSCULAR | Status: DC | PRN
  Administered 2016-08-21: 25 ug via INTRAVENOUS

## 2016-08-21 MED ORDER — HEPARIN (PORCINE) IN NACL 2-0.9 UNIT/ML-% IJ SOLN
INTRAMUSCULAR | Status: AC
  Filled 2016-08-21: qty 500

## 2016-08-21 MED ORDER — IOPAMIDOL (ISOVUE-300) INJECTION 61%
INTRAVENOUS | Status: DC | PRN
  Administered 2016-08-21: 80 mL via INTRA_ARTERIAL

## 2016-08-21 MED ORDER — SODIUM CHLORIDE 0.9% FLUSH: 3.0000 mL | INTRAVENOUS | Status: DC | PRN

## 2016-08-21 MED ORDER — ASPIRIN 81 MG PO CHEW
CHEWABLE_TABLET | ORAL | Status: AC
  Administered 2016-08-21: 81 mg via ORAL
  Filled 2016-08-21: qty 1

## 2016-08-21 MED ORDER — ONDANSETRON HCL 4 MG/2ML IJ SOLN: 4.0000 mg | Freq: Four times a day (QID) | INTRAMUSCULAR | Status: DC | PRN

## 2016-08-21 MED ORDER — SODIUM CHLORIDE 0.9 % IV SOLN: 250.0000 mL | INTRAVENOUS | Status: DC | PRN

## 2016-08-21 SURGICAL SUPPLY — 9 items
CATH INFINITI 5FR ANG PIGTAIL (CATHETERS) ×2 IMPLANT
CATH INFINITI 5FR JL4 (CATHETERS) ×2 IMPLANT
CATH INFINITI JR4 5F (CATHETERS) ×2 IMPLANT
DEVICE CLOSURE MYNXGRIP 5F (Vascular Products) ×2 IMPLANT
KIT MANI 3VAL PERCEP (MISCELLANEOUS) ×2 IMPLANT
NEEDLE PERC 18GX7CM (NEEDLE) ×2 IMPLANT
PACK CARDIAC CATH (CUSTOM PROCEDURE TRAY) ×2 IMPLANT
SHEATH AVANTI 5FR X 11CM (SHEATH) ×2 IMPLANT
WIRE EMERALD 3MM-J .035X150CM (WIRE) ×2 IMPLANT

## 2016-08-28 DIAGNOSIS — G4733 Obstructive sleep apnea (adult) (pediatric): Secondary | ICD-10-CM | POA: Diagnosis not present

## 2016-08-28 DIAGNOSIS — I10 Essential (primary) hypertension: Secondary | ICD-10-CM | POA: Diagnosis not present

## 2016-08-28 DIAGNOSIS — R0602 Shortness of breath: Secondary | ICD-10-CM | POA: Diagnosis not present

## 2016-08-28 DIAGNOSIS — R079 Chest pain, unspecified: Secondary | ICD-10-CM | POA: Diagnosis not present

## 2016-08-28 DIAGNOSIS — R0789 Other chest pain: Secondary | ICD-10-CM | POA: Diagnosis not present

## 2016-09-15 DIAGNOSIS — G4733 Obstructive sleep apnea (adult) (pediatric): Secondary | ICD-10-CM | POA: Diagnosis not present

## 2016-09-15 DIAGNOSIS — J449 Chronic obstructive pulmonary disease, unspecified: Secondary | ICD-10-CM | POA: Diagnosis not present

## 2016-09-15 DIAGNOSIS — R0602 Shortness of breath: Secondary | ICD-10-CM | POA: Diagnosis not present

## 2016-10-22 DIAGNOSIS — G4733 Obstructive sleep apnea (adult) (pediatric): Secondary | ICD-10-CM | POA: Diagnosis not present

## 2016-10-29 DIAGNOSIS — L530 Toxic erythema: Secondary | ICD-10-CM | POA: Diagnosis not present

## 2016-10-29 DIAGNOSIS — D0439 Carcinoma in situ of skin of other parts of face: Secondary | ICD-10-CM | POA: Diagnosis not present

## 2016-10-29 DIAGNOSIS — Z85828 Personal history of other malignant neoplasm of skin: Secondary | ICD-10-CM | POA: Diagnosis not present

## 2016-10-29 DIAGNOSIS — X32XXXA Exposure to sunlight, initial encounter: Secondary | ICD-10-CM | POA: Diagnosis not present

## 2016-10-29 DIAGNOSIS — L820 Inflamed seborrheic keratosis: Secondary | ICD-10-CM | POA: Diagnosis not present

## 2016-10-29 DIAGNOSIS — L570 Actinic keratosis: Secondary | ICD-10-CM | POA: Diagnosis not present

## 2016-10-29 DIAGNOSIS — R208 Other disturbances of skin sensation: Secondary | ICD-10-CM | POA: Diagnosis not present

## 2016-10-31 DIAGNOSIS — G4733 Obstructive sleep apnea (adult) (pediatric): Secondary | ICD-10-CM | POA: Diagnosis not present

## 2016-11-14 DIAGNOSIS — H2513 Age-related nuclear cataract, bilateral: Secondary | ICD-10-CM | POA: Diagnosis not present

## 2016-11-18 DIAGNOSIS — G4733 Obstructive sleep apnea (adult) (pediatric): Secondary | ICD-10-CM | POA: Diagnosis not present

## 2016-12-26 DIAGNOSIS — Z0000 Encounter for general adult medical examination without abnormal findings: Secondary | ICD-10-CM | POA: Diagnosis not present

## 2016-12-26 DIAGNOSIS — I10 Essential (primary) hypertension: Secondary | ICD-10-CM | POA: Diagnosis not present

## 2016-12-26 DIAGNOSIS — G4733 Obstructive sleep apnea (adult) (pediatric): Secondary | ICD-10-CM | POA: Diagnosis not present

## 2016-12-26 DIAGNOSIS — R079 Chest pain, unspecified: Secondary | ICD-10-CM | POA: Diagnosis not present

## 2016-12-26 DIAGNOSIS — K219 Gastro-esophageal reflux disease without esophagitis: Secondary | ICD-10-CM | POA: Diagnosis not present

## 2016-12-26 DIAGNOSIS — R002 Palpitations: Secondary | ICD-10-CM | POA: Diagnosis not present

## 2017-01-02 DIAGNOSIS — G4733 Obstructive sleep apnea (adult) (pediatric): Secondary | ICD-10-CM | POA: Diagnosis not present

## 2017-01-02 DIAGNOSIS — Z0000 Encounter for general adult medical examination without abnormal findings: Secondary | ICD-10-CM | POA: Diagnosis not present

## 2017-01-02 DIAGNOSIS — I10 Essential (primary) hypertension: Secondary | ICD-10-CM | POA: Diagnosis not present

## 2017-01-02 DIAGNOSIS — K219 Gastro-esophageal reflux disease without esophagitis: Secondary | ICD-10-CM | POA: Diagnosis not present

## 2017-01-02 DIAGNOSIS — F329 Major depressive disorder, single episode, unspecified: Secondary | ICD-10-CM | POA: Diagnosis not present

## 2017-02-23 DIAGNOSIS — I10 Essential (primary) hypertension: Secondary | ICD-10-CM | POA: Diagnosis not present

## 2017-02-23 DIAGNOSIS — R0602 Shortness of breath: Secondary | ICD-10-CM | POA: Diagnosis not present

## 2017-02-23 DIAGNOSIS — R079 Chest pain, unspecified: Secondary | ICD-10-CM | POA: Diagnosis not present

## 2017-04-14 ENCOUNTER — Other Ambulatory Visit: Payer: Self-pay | Admitting: Internal Medicine

## 2017-04-14 DIAGNOSIS — Z1231 Encounter for screening mammogram for malignant neoplasm of breast: Secondary | ICD-10-CM

## 2017-04-29 ENCOUNTER — Ambulatory Visit
Admission: RE | Admit: 2017-04-29 | Discharge: 2017-04-29 | Disposition: A | Discharge status: Home / Self Care | Payer: PPO | Source: Ambulatory Visit | Attending: Internal Medicine | Admitting: Internal Medicine

## 2017-04-29 DIAGNOSIS — Z1231 Encounter for screening mammogram for malignant neoplasm of breast: Secondary | ICD-10-CM | POA: Insufficient documentation

## 2017-04-30 DIAGNOSIS — G4733 Obstructive sleep apnea (adult) (pediatric): Secondary | ICD-10-CM | POA: Diagnosis not present

## 2017-05-21 DIAGNOSIS — Z800 Family history of malignant neoplasm of digestive organs: Secondary | ICD-10-CM | POA: Diagnosis not present

## 2017-05-21 DIAGNOSIS — K219 Gastro-esophageal reflux disease without esophagitis: Secondary | ICD-10-CM | POA: Diagnosis not present

## 2017-07-01 DIAGNOSIS — G4733 Obstructive sleep apnea (adult) (pediatric): Secondary | ICD-10-CM | POA: Diagnosis not present

## 2017-07-01 DIAGNOSIS — Z0000 Encounter for general adult medical examination without abnormal findings: Secondary | ICD-10-CM | POA: Diagnosis not present

## 2017-07-01 DIAGNOSIS — F329 Major depressive disorder, single episode, unspecified: Secondary | ICD-10-CM | POA: Diagnosis not present

## 2017-07-01 DIAGNOSIS — I10 Essential (primary) hypertension: Secondary | ICD-10-CM | POA: Diagnosis not present

## 2017-07-01 DIAGNOSIS — K219 Gastro-esophageal reflux disease without esophagitis: Secondary | ICD-10-CM | POA: Diagnosis not present

## 2017-07-07 DIAGNOSIS — Z0000 Encounter for general adult medical examination without abnormal findings: Secondary | ICD-10-CM | POA: Diagnosis not present

## 2017-07-07 DIAGNOSIS — Z780 Asymptomatic menopausal state: Secondary | ICD-10-CM | POA: Diagnosis not present

## 2017-07-07 DIAGNOSIS — G4733 Obstructive sleep apnea (adult) (pediatric): Secondary | ICD-10-CM | POA: Diagnosis not present

## 2017-07-07 DIAGNOSIS — I10 Essential (primary) hypertension: Secondary | ICD-10-CM | POA: Diagnosis not present

## 2017-07-07 DIAGNOSIS — E279 Disorder of adrenal gland, unspecified: Secondary | ICD-10-CM | POA: Diagnosis not present

## 2017-07-07 DIAGNOSIS — F329 Major depressive disorder, single episode, unspecified: Secondary | ICD-10-CM | POA: Diagnosis not present

## 2017-07-08 ENCOUNTER — Other Ambulatory Visit: Payer: Self-pay | Admitting: Internal Medicine

## 2017-07-08 DIAGNOSIS — E279 Disorder of adrenal gland, unspecified: Principal | ICD-10-CM

## 2017-07-08 DIAGNOSIS — E278 Other specified disorders of adrenal gland: Secondary | ICD-10-CM

## 2017-07-27 DIAGNOSIS — G4733 Obstructive sleep apnea (adult) (pediatric): Secondary | ICD-10-CM | POA: Diagnosis not present

## 2017-07-27 DIAGNOSIS — R0602 Shortness of breath: Secondary | ICD-10-CM | POA: Diagnosis not present

## 2017-07-27 DIAGNOSIS — Z01818 Encounter for other preprocedural examination: Secondary | ICD-10-CM | POA: Diagnosis not present

## 2017-07-27 DIAGNOSIS — Z780 Asymptomatic menopausal state: Secondary | ICD-10-CM | POA: Diagnosis not present

## 2017-07-27 DIAGNOSIS — J449 Chronic obstructive pulmonary disease, unspecified: Secondary | ICD-10-CM | POA: Diagnosis not present

## 2017-07-30 ENCOUNTER — Ambulatory Visit
Admission: RE | Admit: 2017-07-30 | Discharge: 2017-07-30 | Disposition: A | Discharge status: Home / Self Care | Payer: PPO | Source: Ambulatory Visit | Attending: Internal Medicine | Admitting: Internal Medicine

## 2017-07-30 ENCOUNTER — Other Ambulatory Visit: Payer: Self-pay | Admitting: Internal Medicine

## 2017-07-30 DIAGNOSIS — N281 Cyst of kidney, acquired: Secondary | ICD-10-CM | POA: Insufficient documentation

## 2017-07-30 DIAGNOSIS — E278 Other specified disorders of adrenal gland: Secondary | ICD-10-CM

## 2017-07-30 DIAGNOSIS — R932 Abnormal findings on diagnostic imaging of liver and biliary tract: Secondary | ICD-10-CM | POA: Diagnosis not present

## 2017-07-30 DIAGNOSIS — E279 Disorder of adrenal gland, unspecified: Secondary | ICD-10-CM | POA: Insufficient documentation

## 2017-07-30 DIAGNOSIS — I700 Atherosclerosis of aorta: Secondary | ICD-10-CM | POA: Insufficient documentation

## 2017-08-06 ENCOUNTER — Encounter: Payer: Self-pay | Admitting: Student

## 2017-08-07 ENCOUNTER — Ambulatory Visit
Admission: RE | Admit: 2017-08-07 | Discharge: 2017-08-07 | Disposition: A | Discharge status: Home / Self Care | Payer: PPO | Source: Ambulatory Visit | Attending: Unknown Physician Specialty | Admitting: Unknown Physician Specialty

## 2017-08-07 ENCOUNTER — Encounter: Payer: Self-pay | Admitting: *Deleted

## 2017-08-07 ENCOUNTER — Encounter
Admission: RE | Disposition: A | Discharge status: Home / Self Care | Payer: Self-pay | Source: Ambulatory Visit | Attending: Unknown Physician Specialty

## 2017-08-07 ENCOUNTER — Ambulatory Visit: Payer: PPO | Admitting: Anesthesiology

## 2017-08-07 DIAGNOSIS — Z87891 Personal history of nicotine dependence: Secondary | ICD-10-CM | POA: Insufficient documentation

## 2017-08-07 DIAGNOSIS — Z79899 Other long term (current) drug therapy: Secondary | ICD-10-CM | POA: Diagnosis not present

## 2017-08-07 DIAGNOSIS — Z885 Allergy status to narcotic agent status: Secondary | ICD-10-CM | POA: Diagnosis not present

## 2017-08-07 DIAGNOSIS — Z85828 Personal history of other malignant neoplasm of skin: Secondary | ICD-10-CM | POA: Diagnosis not present

## 2017-08-07 DIAGNOSIS — E785 Hyperlipidemia, unspecified: Secondary | ICD-10-CM | POA: Insufficient documentation

## 2017-08-07 DIAGNOSIS — K635 Polyp of colon: Secondary | ICD-10-CM | POA: Diagnosis not present

## 2017-08-07 DIAGNOSIS — G4730 Sleep apnea, unspecified: Secondary | ICD-10-CM | POA: Insufficient documentation

## 2017-08-07 DIAGNOSIS — K640 First degree hemorrhoids: Secondary | ICD-10-CM | POA: Insufficient documentation

## 2017-08-07 DIAGNOSIS — J449 Chronic obstructive pulmonary disease, unspecified: Secondary | ICD-10-CM | POA: Diagnosis not present

## 2017-08-07 DIAGNOSIS — M1990 Unspecified osteoarthritis, unspecified site: Secondary | ICD-10-CM | POA: Insufficient documentation

## 2017-08-07 DIAGNOSIS — I10 Essential (primary) hypertension: Secondary | ICD-10-CM | POA: Diagnosis not present

## 2017-08-07 DIAGNOSIS — D123 Benign neoplasm of transverse colon: Secondary | ICD-10-CM | POA: Diagnosis not present

## 2017-08-07 DIAGNOSIS — K3189 Other diseases of stomach and duodenum: Secondary | ICD-10-CM | POA: Diagnosis not present

## 2017-08-07 DIAGNOSIS — K219 Gastro-esophageal reflux disease without esophagitis: Secondary | ICD-10-CM | POA: Insufficient documentation

## 2017-08-07 DIAGNOSIS — Z800 Family history of malignant neoplasm of digestive organs: Secondary | ICD-10-CM | POA: Diagnosis not present

## 2017-08-07 DIAGNOSIS — F329 Major depressive disorder, single episode, unspecified: Secondary | ICD-10-CM | POA: Diagnosis not present

## 2017-08-07 DIAGNOSIS — Z803 Family history of malignant neoplasm of breast: Secondary | ICD-10-CM | POA: Diagnosis not present

## 2017-08-07 DIAGNOSIS — Z8541 Personal history of malignant neoplasm of cervix uteri: Secondary | ICD-10-CM | POA: Insufficient documentation

## 2017-08-07 DIAGNOSIS — R12 Heartburn: Secondary | ICD-10-CM | POA: Diagnosis not present

## 2017-08-07 DIAGNOSIS — Z1211 Encounter for screening for malignant neoplasm of colon: Secondary | ICD-10-CM | POA: Insufficient documentation

## 2017-08-07 DIAGNOSIS — K648 Other hemorrhoids: Secondary | ICD-10-CM | POA: Diagnosis not present

## 2017-08-07 HISTORY — DX: Anemia, unspecified: D64.9

## 2017-08-07 HISTORY — DX: Headache: R51

## 2017-08-07 HISTORY — DX: Depression, unspecified: F32.A

## 2017-08-07 HISTORY — DX: Hyperlipidemia, unspecified: E78.5

## 2017-08-07 HISTORY — DX: Other specified disorders of adrenal gland: E27.8

## 2017-08-07 HISTORY — DX: Disorder of adrenal gland, unspecified: E27.9

## 2017-08-07 HISTORY — DX: Headache, unspecified: R51.9

## 2017-08-07 HISTORY — DX: Major depressive disorder, single episode, unspecified: F32.9

## 2017-08-07 HISTORY — PX: COLONOSCOPY WITH PROPOFOL: SHX5780

## 2017-08-07 HISTORY — DX: Gastro-esophageal reflux disease without esophagitis: K21.9

## 2017-08-07 HISTORY — PX: ESOPHAGOGASTRODUODENOSCOPY (EGD) WITH PROPOFOL: SHX5813

## 2017-08-07 SURGERY — COLONOSCOPY WITH PROPOFOL
Anesthesia: General

## 2017-08-07 MED ORDER — FENTANYL CITRATE (PF) 100 MCG/2ML IJ SOLN
INTRAMUSCULAR | Status: DC | PRN
  Administered 2017-08-07 (×4): 25 ug via INTRAVENOUS

## 2017-08-07 MED ORDER — SODIUM CHLORIDE 0.9 % IV SOLN: INTRAVENOUS | Status: DC

## 2017-08-07 MED ORDER — IPRATROPIUM-ALBUTEROL 0.5-2.5 (3) MG/3ML IN SOLN
RESPIRATORY_TRACT | Status: AC
  Administered 2017-08-07: 3 mL via RESPIRATORY_TRACT
  Filled 2017-08-07: qty 3

## 2017-08-07 MED ORDER — SODIUM CHLORIDE 0.9 % IV SOLN
INTRAVENOUS | Status: DC
  Administered 2017-08-07: 10:00:00 via INTRAVENOUS

## 2017-08-07 MED ORDER — LIDOCAINE HCL (PF) 2 % IJ SOLN
INTRAMUSCULAR | Status: AC
  Filled 2017-08-07: qty 10

## 2017-08-07 MED ORDER — LIDOCAINE HCL (PF) 2 % IJ SOLN
INTRAMUSCULAR | Status: DC | PRN
  Administered 2017-08-07: 60 mg

## 2017-08-07 MED ORDER — PHENYLEPHRINE HCL 10 MG/ML IJ SOLN
INTRAMUSCULAR | Status: DC | PRN
  Administered 2017-08-07 (×3): 100 ug via INTRAVENOUS

## 2017-08-07 MED ORDER — MIDAZOLAM HCL 5 MG/5ML IJ SOLN
INTRAMUSCULAR | Status: DC | PRN
  Administered 2017-08-07 (×2): 1 mg via INTRAVENOUS

## 2017-08-07 MED ORDER — GLYCOPYRROLATE 0.2 MG/ML IJ SOLN
INTRAMUSCULAR | Status: AC
  Filled 2017-08-07: qty 1

## 2017-08-07 MED ORDER — PROPOFOL 500 MG/50ML IV EMUL
INTRAVENOUS | Status: AC
  Filled 2017-08-07: qty 50

## 2017-08-07 MED ORDER — FENTANYL CITRATE (PF) 100 MCG/2ML IJ SOLN
INTRAMUSCULAR | Status: AC
  Filled 2017-08-07: qty 2

## 2017-08-07 MED ORDER — ONDANSETRON HCL 4 MG/2ML IJ SOLN
INTRAMUSCULAR | Status: DC | PRN
  Administered 2017-08-07: 4 mg via INTRAVENOUS

## 2017-08-07 MED ORDER — GLYCOPYRROLATE 0.2 MG/ML IJ SOLN
INTRAMUSCULAR | Status: DC | PRN
  Administered 2017-08-07: 0.2 mg via INTRAVENOUS

## 2017-08-07 MED ORDER — ONDANSETRON HCL 4 MG/2ML IJ SOLN
INTRAMUSCULAR | Status: AC
Start: 2017-08-07 — End: 2017-08-07
  Filled 2017-08-07: qty 2

## 2017-08-07 MED ORDER — IPRATROPIUM-ALBUTEROL 0.5-2.5 (3) MG/3ML IN SOLN
3.0000 mL | Freq: Once | RESPIRATORY_TRACT | Status: AC
Start: 2017-08-07 — End: 2017-08-07
  Administered 2017-08-07: 3 mL via RESPIRATORY_TRACT

## 2017-08-07 MED ORDER — MIDAZOLAM HCL 2 MG/2ML IJ SOLN
INTRAMUSCULAR | Status: AC
  Filled 2017-08-07: qty 2

## 2017-08-07 MED ORDER — PROPOFOL 10 MG/ML IV BOLUS
INTRAVENOUS | Status: DC | PRN
  Administered 2017-08-07 (×3): 10 mg via INTRAVENOUS
  Administered 2017-08-07: 30 mg via INTRAVENOUS

## 2017-08-07 MED ORDER — PROPOFOL 500 MG/50ML IV EMUL
INTRAVENOUS | Status: DC | PRN
  Administered 2017-08-07: 50 ug/kg/min via INTRAVENOUS

## 2017-08-07 NOTE — Anesthesia Post-op Follow-up Note (Signed)
Anesthesia QCDR form completed.        

## 2017-08-07 NOTE — Anesthesia Postprocedure Evaluation (Signed)
Anesthesia Post Note  Patient: Lisa Cowan  Procedure(s) Performed: COLONOSCOPY WITH PROPOFOL (N/A ) ESOPHAGOGASTRODUODENOSCOPY (EGD) WITH PROPOFOL (N/A )  Patient location during evaluation: Endoscopy Anesthesia Type: General Level of consciousness: awake and alert Pain management: pain level controlled Vital Signs Assessment: post-procedure vital signs reviewed and stable Respiratory status: spontaneous breathing, nonlabored ventilation, respiratory function stable and patient connected to nasal cannula oxygen Cardiovascular status: blood pressure returned to baseline and stable Postop Assessment: no apparent nausea or vomiting Anesthetic complications: no     Last Vitals:  Vitals:   08/07/17 0954 08/07/17 1103  BP: (!) 146/72 (!) 95/48  Pulse: 72   Resp: 20 15  Temp: 36.6 C (!) 36.3 C  SpO2: 97%     Last Pain:  Vitals:   08/07/17 1103  TempSrc: Tympanic                 Precious Haws Piscitello

## 2017-08-07 NOTE — Anesthesia Preprocedure Evaluation (Signed)
Anesthesia Evaluation  Patient identified by MRN, date of birth, ID band Patient awake    Reviewed: Allergy & Precautions, NPO status , Patient's Chart, lab work & pertinent test results  History of Anesthesia Complications Negative for: history of anesthetic complications  Airway Mallampati: II  TM Distance: >3 FB Neck ROM: Full    Dental no notable dental hx.    Pulmonary sleep apnea and Continuous Positive Airway Pressure Ventilation , COPD, former smoker,    breath sounds clear to auscultation- rhonchi (-) wheezing      Cardiovascular hypertension, Pt. on medications (-) CAD, (-) Past MI, (-) Cardiac Stents and (-) CABG  Rhythm:Regular Rate:Normal - Systolic murmurs and - Diastolic murmurs    Neuro/Psych  Headaches, PSYCHIATRIC DISORDERS Depression    GI/Hepatic Neg liver ROS, GERD  ,  Endo/Other  negative endocrine ROSneg diabetes  Renal/GU negative Renal ROS     Musculoskeletal  (+) Arthritis ,   Abdominal (+) + obese,   Peds  Hematology  (+) anemia ,   Anesthesia Other Findings Past Medical History: No date: Adrenal nodule (HCC) No date: Anemia No date: Anginal pain (HCC) No date: Arthritis No date: Cancer (HCC)     Comment:  cervical ca No date: COPD (chronic obstructive pulmonary disease) (HCC) No date: Depression No date: GERD (gastroesophageal reflux disease) No date: Headache No date: Hyperlipidemia No date: Hypertension No date: Skin cancer No date: Sleep apnea   Reproductive/Obstetrics                             Anesthesia Physical Anesthesia Plan  ASA: III  Anesthesia Plan: General   Post-op Pain Management:    Induction: Intravenous  PONV Risk Score and Plan: 2 and Propofol infusion  Airway Management Planned: Natural Airway  Additional Equipment:   Intra-op Plan:   Post-operative Plan:   Informed Consent: I have reviewed the patients History and  Physical, chart, labs and discussed the procedure including the risks, benefits and alternatives for the proposed anesthesia with the patient or authorized representative who has indicated his/her understanding and acceptance.   Dental advisory given  Plan Discussed with: CRNA and Anesthesiologist  Anesthesia Plan Comments:         Anesthesia Quick Evaluation

## 2017-08-07 NOTE — H&P (Signed)
Primary Care Physician:  Tracie Harrier, MD Primary Gastroenterologist:  Dr. Vira Agar  Pre-Procedure History & Physical: HPI:  Lisa Cowan is a 77 y.o. female is here for an endoscopy and colonoscopy.   Past Medical History:  Diagnosis Date  . Adrenal nodule ([place])   . Anemia   . Anginal pain ([place])   . Arthritis   . Cancer ([place])    cervical ca  . COPD (chronic obstructive pulmonary disease) ([place])   . Depression   . GERD (gastroesophageal reflux disease)   . Headache   . Hyperlipidemia   . Hypertension   . Skin cancer   . Sleep apnea     Past Surgical History:  Procedure Laterality Date  . ABDOMINAL HYSTERECTOMY    . ADENOIDECTOMY    . BREAST BIOPSY Right 01/04/2016   stereo  . CARDIAC CATHETERIZATION Left 08/21/2016   Procedure: Left Heart Cath and Coronary Angiography;  Surgeon: Isaias Cowman, MD;  Location: [place] [facility] [facility];  Service: Cardiovascular;  Laterality: Left;  . ROTATOR CUFF REPAIR Left   . TONSILLECTOMY      Prior to Admission medications   Medication Sig Start Date End Date Taking? Authorizing Provider  albuterol (PROVENTIL HFA;VENTOLIN HFA) 108 (90 Base) MCG/ACT inhaler Inhale 2 puffs into the lungs every 4 (four) hours as needed for wheezing or shortness of breath.   Yes [provider]  atenolol (TENORMIN) 25 MG tablet Take 25 mg by mouth daily.   Yes [provider]  azelastine (ASTELIN) 0.1 % nasal spray Place 1 spray into the nose 2 (two) times daily as needed for allergies.   Yes [provider]  calcium-vitamin D (OSCAL WITH D) 250-125 MG-UNIT tablet Take 1 tablet by mouth daily.   Yes [provider]  Cyanocobalamin 2500 MCG CHEW Chew 2,500 mcg by mouth daily.   Yes [provider]  FLUoxetine (PROZAC) 20 MG capsule Take 20 mg by mouth daily.   Yes [provider]  gabapentin (NEURONTIN) 300 MG capsule Take 300 mg by mouth 2 (two) times daily.   Yes [provider]   meloxicam (MOBIC) 7.5 MG tablet Take 7.5 mg by mouth daily.   Yes [provider]  methocarbamol (ROBAXIN) 500 MG tablet Take 500 mg by mouth every 8 (eight) hours as needed for muscle spasms.   Yes [provider]  omeprazole (PRILOSEC) 20 MG capsule Take 20 mg by mouth 2 (two) times daily before a meal.   Yes [provider]  tiotropium (SPIRIVA) 18 MCG inhalation capsule Place 18 mcg into inhaler and inhale daily.   Yes [provider]  triamterene-hydrochlorothiazide (DYAZIDE) 37.5-25 MG capsule Take 1 capsule by mouth daily.   Yes [provider]  mometasone (ELOCON) 0.1 % ointment Apply 1 application topically daily. 07/04/16   [provider]    Allergies as of 07/06/2017 - Review Complete 08/21/2016  Allergen Reaction Noted  . Codeine Itching and Other (See Comments) 12/28/2013  . Hydrocodone-acetaminophen Itching and Other (See Comments) 12/28/2013  . Nucynta [tapentadol] Itching and Other (See Comments) 08/15/2016  . Oxycodone-acetaminophen Itching and Other (See Comments) 12/28/2013    Family History  Problem Relation Age of Onset  . Breast cancer Sister 73    Social History   Socioeconomic History  . Marital status: Married    Spouse name: Not on file  . Number of children: Not on file  . Years of education: Not on file  . Highest education level: Not  on file  Social Needs  . Financial resource strain: Not on file  . Food insecurity - worry: Not on file  . Food insecurity - inability: Not on file  . Transportation needs - medical: Not on file  . Transportation needs - non-medical: Not on file  Occupational History  . Not on file  Tobacco Use  . Smoking status: Former Research scientist (life sciences)  . Smokeless tobacco: Never Used  Substance and Sexual Activity  . Alcohol use: Yes    Comment: occassionally  . Drug use: No  . Sexual activity: Not on file  Other Topics Concern  . Not on file  Social History Narrative  . Not on  file    Review of Systems: See HPI, otherwise negative ROS  Physical Exam: BP (!) 146/72   Pulse 72   Temp 97.9 F (36.6 C) (Tympanic)   Resp 20   Ht 4\' 11"  (1.499 m)   Wt 81.6 kg (180 lb)   SpO2 97%   BMI 36.36 kg/m  General:   Alert,  pleasant and cooperative in NAD Head:  Normocephalic and atraumatic. Neck:  Supple; no masses or thyromegaly. Lungs:  Clear throughout to auscultation.    Heart:  Regular rate and rhythm. Abdomen:  Soft, nontender and nondistended. Normal bowel sounds, without guarding, and without rebound.   Neurologic:  Alert and  oriented x4;  grossly normal neurologically.  Impression/Plan: Lisa Cowan is here for an endoscopy and colonoscopy to be performed for FH colon cancer and GERD.  Risks, benefits, limitations, and alternatives regarding  endoscopy and colonoscopy have been reviewed with the patient.  Questions have been answered.  All parties agreeable.   Gaylyn Cheers, MD  08/07/2017, 10:22 AM

## 2017-08-07 NOTE — Transfer of Care (Signed)
Immediate Anesthesia Transfer of Care Note  Patient: Lisa Cowan  Procedure(s) Performed: COLONOSCOPY WITH PROPOFOL (N/A ) ESOPHAGOGASTRODUODENOSCOPY (EGD) WITH PROPOFOL (N/A )  Patient Location: PACU  Anesthesia Type:General  Level of Consciousness: sedated  Airway & Oxygen Therapy: Patient Spontanous Breathing and Patient connected to nasal cannula oxygen  Post-op Assessment: Report given to RN and Post -op Vital signs reviewed and stable  Post vital signs: Reviewed and stable  Last Vitals:  Vitals:   08/07/17 0954 08/07/17 1103  BP: (!) 146/72   Pulse: 72   Resp: 20 (P) 15  Temp: 36.6 C (!) (P) 36.3 C  SpO2: 97%     Last Pain:  Vitals:   08/07/17 1103  TempSrc: (P) Tympanic         Complications: No apparent anesthesia complications

## 2017-08-07 NOTE — Op Note (Signed)
[facility] Gastroenterology Patient Name: Lisa Cowan Procedure Date: 08/07/2017 10:23 AM MRN: [id number] Account #: [id number] Date of Birth: [birth year] Admit Type: Outpatient Age: 77 Room: [facility] ENDO ROOM 3 Gender: Female Note Status: Finalized Procedure:            Upper GI endoscopy Indications:          Heartburn, For therapy of gastro-esophageal reflux                        disease Providers:            Manya Silvas, MD Referring MD:         Tracie Harrier, MD (Referring MD) Medicines:            Propofol per Anesthesia Complications:        No immediate complications. Procedure:            Pre-Anesthesia Assessment:                       - After reviewing the risks and benefits, the patient                        was deemed in satisfactory condition to undergo the                        procedure.                       After obtaining informed consent, the endoscope was                        passed under direct vision. Throughout the procedure,                        the patient's blood pressure, pulse, and oxygen                        saturations were monitored continuously. The                        Colonoscope was introduced through the mouth, and                        advanced to the second part of duodenum. The upper GI                        endoscopy was accomplished without difficulty. The                        patient tolerated the procedure well. Findings:      The examined esophagus was normal. GEJ 38cm.      Diffuse nodular mucosa was found in the gastric body.      The stomach was otherwise normal.      The examined duodenum was normal. Impression:           - Normal esophagus.                       - Nodular mucosa in the gastric body.                       -  Normal stomach.                       - Normal examined duodenum.                       - No specimens collected. Recommendation:       - Perform a colonoscopy as  previously scheduled.                        continue medication. Manya Silvas, MD 08/07/2017 10:38:15 AM This report has been signed electronically. Number of Addenda: 0 Note Initiated On: 08/07/2017 10:23 AM      [facility]

## 2017-08-07 NOTE — Op Note (Signed)
[facility] Gastroenterology Patient Name: Lisa Cowan Procedure Date: 08/07/2017 10:22 AM MRN: [id number] Account #: [id number] Date of Birth: 09-26-1940 Admit Type: Outpatient Age: 77 Room: [facility] ENDO ROOM 3 Gender: Female Note Status: Finalized Procedure:            Colonoscopy Indications:          Screening in patient at increased risk: Family history                        of 1st-degree relative with colorectal cancer Providers:            Manya Silvas, MD Referring MD:         Tracie Harrier, MD (Referring MD) Medicines:            Propofol per Anesthesia Complications:        No immediate complications. Procedure:            Pre-Anesthesia Assessment:                       - After reviewing the risks and benefits, the patient                        was deemed in satisfactory condition to undergo the                        procedure.                       After obtaining informed consent, the colonoscope was                        passed under direct vision. Throughout the procedure,                        the patient's blood pressure, pulse, and oxygen                        saturations were monitored continuously. The                        Colonoscope was introduced through the anus and                        advanced to the the cecum, identified by appendiceal                        orifice and ileocecal valve. The colonoscopy was                        performed without difficulty. The patient tolerated the                        procedure well. The quality of the bowel preparation                        was excellent. Findings:      A diminutive polyp was found in the hepatic flexure. The polyp was       sessile. The polyp was removed with a jumbo cold forceps. Resection and       retrieval were complete.      External  and internal hemorrhoids were found during endoscopy. The       hemorrhoids were small and Grade I (internal hemorrhoids  that do not       prolapse).      The exam was otherwise without abnormality. Impression:           - One diminutive polyp at the hepatic flexure, removed                        with a jumbo cold forceps. Resected and retrieved.                       - Internal hemorrhoids.                       - The examination was otherwise normal. Recommendation:       - Await pathology results. Manya Silvas, MD 08/07/2017 11:02:37 AM This report has been signed electronically. Number of Addenda: 0 Note Initiated On: 08/07/2017 10:22 AM Scope Withdrawal Time: 0 hours 10 minutes 52 seconds  Total Procedure Duration: 0 hours 18 minutes 31 seconds       [facility]

## 2017-08-08 ENCOUNTER — Encounter: Payer: Self-pay | Admitting: Unknown Physician Specialty

## 2017-08-10 LAB — SURGICAL PATHOLOGY

## 2017-09-03 DIAGNOSIS — R079 Chest pain, unspecified: Secondary | ICD-10-CM | POA: Diagnosis not present

## 2017-09-03 DIAGNOSIS — R0789 Other chest pain: Secondary | ICD-10-CM | POA: Diagnosis not present

## 2017-09-03 DIAGNOSIS — I10 Essential (primary) hypertension: Secondary | ICD-10-CM | POA: Diagnosis not present

## 2017-09-03 DIAGNOSIS — R0602 Shortness of breath: Secondary | ICD-10-CM | POA: Diagnosis not present

## 2017-09-03 DIAGNOSIS — G4733 Obstructive sleep apnea (adult) (pediatric): Secondary | ICD-10-CM | POA: Diagnosis not present

## 2017-10-28 DIAGNOSIS — L821 Other seborrheic keratosis: Secondary | ICD-10-CM | POA: Diagnosis not present

## 2017-10-28 DIAGNOSIS — Z08 Encounter for follow-up examination after completed treatment for malignant neoplasm: Secondary | ICD-10-CM | POA: Diagnosis not present

## 2017-10-28 DIAGNOSIS — D485 Neoplasm of uncertain behavior of skin: Secondary | ICD-10-CM | POA: Diagnosis not present

## 2017-10-28 DIAGNOSIS — Z85828 Personal history of other malignant neoplasm of skin: Secondary | ICD-10-CM | POA: Diagnosis not present

## 2017-10-29 DIAGNOSIS — D045 Carcinoma in situ of skin of trunk: Secondary | ICD-10-CM | POA: Diagnosis not present

## 2017-11-05 DIAGNOSIS — D045 Carcinoma in situ of skin of trunk: Secondary | ICD-10-CM | POA: Diagnosis not present

## 2017-11-12 DIAGNOSIS — G4733 Obstructive sleep apnea (adult) (pediatric): Secondary | ICD-10-CM | POA: Diagnosis not present

## 2017-11-18 DIAGNOSIS — G4733 Obstructive sleep apnea (adult) (pediatric): Secondary | ICD-10-CM | POA: Diagnosis not present

## 2017-11-18 DIAGNOSIS — J300 Vasomotor rhinitis: Secondary | ICD-10-CM | POA: Diagnosis not present

## 2017-12-29 DIAGNOSIS — I10 Essential (primary) hypertension: Secondary | ICD-10-CM | POA: Diagnosis not present

## 2017-12-29 DIAGNOSIS — Z780 Asymptomatic menopausal state: Secondary | ICD-10-CM | POA: Diagnosis not present

## 2017-12-29 DIAGNOSIS — E279 Disorder of adrenal gland, unspecified: Secondary | ICD-10-CM | POA: Diagnosis not present

## 2017-12-29 DIAGNOSIS — F329 Major depressive disorder, single episode, unspecified: Secondary | ICD-10-CM | POA: Diagnosis not present

## 2017-12-29 DIAGNOSIS — G4733 Obstructive sleep apnea (adult) (pediatric): Secondary | ICD-10-CM | POA: Diagnosis not present

## 2017-12-29 DIAGNOSIS — Z0000 Encounter for general adult medical examination without abnormal findings: Secondary | ICD-10-CM | POA: Diagnosis not present

## 2018-01-05 DIAGNOSIS — F329 Major depressive disorder, single episode, unspecified: Secondary | ICD-10-CM | POA: Diagnosis not present

## 2018-01-05 DIAGNOSIS — Z0000 Encounter for general adult medical examination without abnormal findings: Secondary | ICD-10-CM | POA: Diagnosis not present

## 2018-01-05 DIAGNOSIS — G4733 Obstructive sleep apnea (adult) (pediatric): Secondary | ICD-10-CM | POA: Diagnosis not present

## 2018-01-05 DIAGNOSIS — E279 Disorder of adrenal gland, unspecified: Secondary | ICD-10-CM | POA: Diagnosis not present

## 2018-01-05 DIAGNOSIS — K219 Gastro-esophageal reflux disease without esophagitis: Secondary | ICD-10-CM | POA: Diagnosis not present

## 2018-01-05 DIAGNOSIS — I10 Essential (primary) hypertension: Secondary | ICD-10-CM | POA: Diagnosis not present

## 2018-01-05 DIAGNOSIS — R238 Other skin changes: Secondary | ICD-10-CM | POA: Diagnosis not present

## 2018-01-05 DIAGNOSIS — R0789 Other chest pain: Secondary | ICD-10-CM | POA: Diagnosis not present

## 2018-01-27 DIAGNOSIS — G4733 Obstructive sleep apnea (adult) (pediatric): Secondary | ICD-10-CM | POA: Diagnosis not present

## 2018-01-27 DIAGNOSIS — J449 Chronic obstructive pulmonary disease, unspecified: Secondary | ICD-10-CM | POA: Diagnosis not present

## 2018-03-17 DIAGNOSIS — I10 Essential (primary) hypertension: Secondary | ICD-10-CM | POA: Diagnosis not present

## 2018-03-17 DIAGNOSIS — R0602 Shortness of breath: Secondary | ICD-10-CM | POA: Diagnosis not present

## 2018-03-17 DIAGNOSIS — G4733 Obstructive sleep apnea (adult) (pediatric): Secondary | ICD-10-CM | POA: Diagnosis not present

## 2018-03-17 DIAGNOSIS — R079 Chest pain, unspecified: Secondary | ICD-10-CM | POA: Diagnosis not present

## 2018-04-08 DIAGNOSIS — H2513 Age-related nuclear cataract, bilateral: Secondary | ICD-10-CM | POA: Diagnosis not present

## 2018-05-17 DIAGNOSIS — G4733 Obstructive sleep apnea (adult) (pediatric): Secondary | ICD-10-CM | POA: Diagnosis not present

## 2018-05-25 DIAGNOSIS — M5442 Lumbago with sciatica, left side: Secondary | ICD-10-CM | POA: Diagnosis not present

## 2018-05-25 DIAGNOSIS — M5137 Other intervertebral disc degeneration, lumbosacral region: Secondary | ICD-10-CM | POA: Diagnosis not present

## 2018-05-25 DIAGNOSIS — M5441 Lumbago with sciatica, right side: Secondary | ICD-10-CM | POA: Diagnosis not present

## 2018-05-25 DIAGNOSIS — M25551 Pain in right hip: Secondary | ICD-10-CM | POA: Diagnosis not present

## 2018-05-25 DIAGNOSIS — G8929 Other chronic pain: Secondary | ICD-10-CM | POA: Diagnosis not present

## 2018-05-25 DIAGNOSIS — M25552 Pain in left hip: Secondary | ICD-10-CM | POA: Diagnosis not present

## 2018-06-05 ENCOUNTER — Other Ambulatory Visit: Payer: Self-pay

## 2018-06-05 ENCOUNTER — Emergency Department: Payer: PPO

## 2018-06-05 ENCOUNTER — Observation Stay
Admission: EM | Admit: 2018-06-05 | Discharge: 2018-06-07 | Disposition: A | Discharge status: Home / Self Care | Payer: PPO | Attending: Internal Medicine | Admitting: Internal Medicine

## 2018-06-05 DIAGNOSIS — F329 Major depressive disorder, single episode, unspecified: Secondary | ICD-10-CM | POA: Insufficient documentation

## 2018-06-05 DIAGNOSIS — G4733 Obstructive sleep apnea (adult) (pediatric): Secondary | ICD-10-CM | POA: Diagnosis not present

## 2018-06-05 DIAGNOSIS — R0789 Other chest pain: Secondary | ICD-10-CM | POA: Diagnosis not present

## 2018-06-05 DIAGNOSIS — Z79899 Other long term (current) drug therapy: Secondary | ICD-10-CM | POA: Insufficient documentation

## 2018-06-05 DIAGNOSIS — G629 Polyneuropathy, unspecified: Secondary | ICD-10-CM | POA: Insufficient documentation

## 2018-06-05 DIAGNOSIS — R0600 Dyspnea, unspecified: Secondary | ICD-10-CM | POA: Diagnosis not present

## 2018-06-05 DIAGNOSIS — R7989 Other specified abnormal findings of blood chemistry: Secondary | ICD-10-CM | POA: Diagnosis not present

## 2018-06-05 DIAGNOSIS — Z8541 Personal history of malignant neoplasm of cervix uteri: Secondary | ICD-10-CM | POA: Diagnosis not present

## 2018-06-05 DIAGNOSIS — Z87891 Personal history of nicotine dependence: Secondary | ICD-10-CM | POA: Diagnosis not present

## 2018-06-05 DIAGNOSIS — Z791 Long term (current) use of non-steroidal anti-inflammatories (NSAID): Secondary | ICD-10-CM | POA: Diagnosis not present

## 2018-06-05 DIAGNOSIS — I10 Essential (primary) hypertension: Secondary | ICD-10-CM | POA: Diagnosis present

## 2018-06-05 DIAGNOSIS — R079 Chest pain, unspecified: Secondary | ICD-10-CM | POA: Diagnosis not present

## 2018-06-05 DIAGNOSIS — J449 Chronic obstructive pulmonary disease, unspecified: Secondary | ICD-10-CM | POA: Diagnosis not present

## 2018-06-05 DIAGNOSIS — R0602 Shortness of breath: Secondary | ICD-10-CM | POA: Diagnosis not present

## 2018-06-05 DIAGNOSIS — K219 Gastro-esophageal reflux disease without esophagitis: Secondary | ICD-10-CM | POA: Diagnosis not present

## 2018-06-05 DIAGNOSIS — E785 Hyperlipidemia, unspecified: Secondary | ICD-10-CM | POA: Diagnosis present

## 2018-06-05 DIAGNOSIS — Z85828 Personal history of other malignant neoplasm of skin: Secondary | ICD-10-CM | POA: Insufficient documentation

## 2018-06-05 DIAGNOSIS — R55 Syncope and collapse: Secondary | ICD-10-CM | POA: Insufficient documentation

## 2018-06-05 LAB — CBC
HCT: 41.6 % (ref 36.0–46.0)
Hemoglobin: 13.8 g/dL (ref 12.0–15.0)
MCH: 29 pg (ref 26.0–34.0)
MCHC: 33.2 g/dL (ref 30.0–36.0)
MCV: 87.4 fL (ref 80.0–100.0)
PLATELETS: 256 10*3/uL (ref 150–400)
RBC: 4.76 MIL/uL (ref 3.87–5.11)
RDW: 13.7 % (ref 11.5–15.5)
WBC: 9.6 10*3/uL (ref 4.0–10.5)
nRBC: 0 % (ref 0.0–0.2)

## 2018-06-05 LAB — BASIC METABOLIC PANEL
ANION GAP: 11 (ref 5–15)
BUN: 17 mg/dL (ref 8–23)
CHLORIDE: 89 mmol/L — AB (ref 98–111)
CO2: 32 mmol/L (ref 22–32)
Calcium: 9.1 mg/dL (ref 8.9–10.3)
Creatinine, Ser: 0.83 mg/dL (ref 0.44–1.00)
GFR calc Af Amer: >60 mL/min (ref >60)
GLUCOSE: 112 mg/dL — AB (ref 70–99)
POTASSIUM: 3.1 mmol/L — AB (ref 3.5–5.1)
Sodium: 132 mmol/L — ABNORMAL LOW (ref 135–145)

## 2018-06-05 LAB — TROPONIN I: Troponin I: <0.03 ng/mL (ref <0.03)

## 2018-06-05 MED ORDER — NITROGLYCERIN 0.4 MG SL SUBL
0.4000 mg | SUBLINGUAL_TABLET | SUBLINGUAL | Status: DC | PRN
  Administered 2018-06-05: 0.4 mg via SUBLINGUAL
  Filled 2018-06-05 (×2): qty 1

## 2018-06-05 MED ORDER — LIDOCAINE VISCOUS HCL 2 % MT SOLN
15.0000 mL | Freq: Once | OROMUCOSAL | Status: AC
  Administered 2018-06-05: 15 mL via OROMUCOSAL
  Filled 2018-06-05: qty 15

## 2018-06-05 MED ORDER — NITROGLYCERIN 0.4 MG SL SUBL
0.4000 mg | SUBLINGUAL_TABLET | SUBLINGUAL | Status: DC | PRN
  Administered 2018-06-05: 0.4 mg via SUBLINGUAL

## 2018-06-05 NOTE — ED Notes (Signed)
Repeat EKG performed per MD Archie Balboa request.

## 2018-06-05 NOTE — ED Notes (Signed)
Full rainbow sent to lab.  

## 2018-06-05 NOTE — ED Notes (Addendum)
Pt resting comfortably

## 2018-06-05 NOTE — ED Notes (Signed)
Report given to Vanessa RN.

## 2018-06-05 NOTE — H&P (Signed)
[facility] [place] NAME: Lisa Cowan    MR#:  [id number]  DATE OF BIRTH:  Aug 25, 1940  DATE OF ADMISSION:  06/05/2018  PRIMARY CARE PHYSICIAN: Tracie Harrier, MD   REQUESTING/REFERRING PHYSICIAN: Archie Balboa, MD  CHIEF COMPLAINT:   Chief Complaint  Patient presents with  . Chest Pain  . Near Syncope    HISTORY OF PRESENT ILLNESS:  Lisa Cowan  is a 77 y.o. female who presents with chief complaint as above.  Presents to the ED with a complaint of chest discomfort.  She states that this started earlier today and was "like someone sitting on my chest".  She states that it radiated some to her neck and was associated with shortness of breath.  This pain has been persistent.  It was alleviated some by the nitroglycerin here in the ED, but has still not resolved entirely.  She had 2- troponins in the ED.  Given persistent symptoms hospitalist were called for admission and further evaluation  PAST MEDICAL HISTORY:   Past Medical History:  Diagnosis Date  . Adrenal nodule ([place])   . Anemia   . Anginal pain ([place])   . Arthritis   . Cancer ([place])    cervical ca  . COPD (chronic obstructive pulmonary disease) ([place])   . Depression   . GERD (gastroesophageal reflux disease)   . Headache   . Hyperlipidemia   . Hypertension   . Skin cancer   . Sleep apnea      PAST SURGICAL HISTORY:   Past Surgical History:  Procedure Laterality Date  . ABDOMINAL HYSTERECTOMY    . ADENOIDECTOMY    . BREAST BIOPSY Right 01/04/2016   stereo  . CARDIAC CATHETERIZATION Left 08/21/2016   Procedure: Left Heart Cath and Coronary Angiography;  Surgeon: Isaias Cowman, MD;  Location: [facility];  Service: Cardiovascular;  Laterality: Left;  . COLONOSCOPY WITH PROPOFOL N/A 08/07/2017   Procedure: COLONOSCOPY WITH PROPOFOL;  Surgeon: Manya Silvas, MD;  Location: [facility] ENDOSCOPY;  Service: Endoscopy;  Laterality: N/A;  .  ESOPHAGOGASTRODUODENOSCOPY (EGD) WITH PROPOFOL N/A 08/07/2017   Procedure: ESOPHAGOGASTRODUODENOSCOPY (EGD) WITH PROPOFOL;  Surgeon: Manya Silvas, MD;  Location: [facility] ENDOSCOPY;  Service: Endoscopy;  Laterality: N/A;  . ROTATOR CUFF REPAIR Left   . TONSILLECTOMY       SOCIAL HISTORY:   Social History   Tobacco Use  . Smoking status: Former Research scientist (life sciences)  . Smokeless tobacco: Never Used  Substance Use Topics  . Alcohol use: Yes    Comment: occassionally     FAMILY HISTORY:   Family History  Problem Relation Age of Onset  . Breast cancer Sister 43     DRUG ALLERGIES:   Allergies  Allergen Reactions  . Codeine Itching and Other (See Comments)    hallucinations  . Hydrocodone-Acetaminophen Itching and Other (See Comments)    hallucinations  . Nucynta [Tapentadol] Itching and Other (See Comments)    hallucinations  . Oxycodone-Acetaminophen Itching and Other (See Comments)    hallucinations  . Statins     MEDICATIONS AT HOME:   Prior to Admission medications   Medication Sig Start Date End Date Taking? Authorizing Provider  albuterol (PROVENTIL HFA;VENTOLIN HFA) 108 (90 Base) MCG/ACT inhaler Inhale 2 puffs into the lungs every 4 (four) hours as needed for wheezing or shortness of breath.    [provider]  atenolol (TENORMIN) 25 MG tablet Take 25 mg by mouth daily.    [provider]  azelastine (ASTELIN) 0.1 % nasal spray Place 1 spray into the nose 2 (two) times daily as needed for allergies.    [provider]  calcium-vitamin D (OSCAL WITH D) 250-125 MG-UNIT tablet Take 1 tablet by mouth daily.    [provider]  Cyanocobalamin 2500 MCG CHEW Chew 2,500 mcg by mouth daily.    [provider]  FLUoxetine (PROZAC) 20 MG capsule Take 20 mg by mouth daily.    [provider]  gabapentin (NEURONTIN) 300 MG capsule Take 300 mg by mouth 2 (two) times daily.    [provider]  meloxicam (MOBIC) 7.5 MG tablet  Take 7.5 mg by mouth daily.    [provider]  methocarbamol (ROBAXIN) 500 MG tablet Take 500 mg by mouth every 8 (eight) hours as needed for muscle spasms.    [provider]  mometasone (ELOCON) 0.1 % ointment Apply 1 application topically daily. 07/04/16   [provider]  Multiple Vitamin (MULTIVITAMIN WITH MINERALS) TABS tablet Take 1 tablet by mouth daily.    [provider]  omeprazole (PRILOSEC) 20 MG capsule Take 20 mg by mouth 2 (two) times daily before a meal.    [provider]  rosuvastatin (CRESTOR) 5 MG tablet Take 5 mg by mouth daily. 03/17/18   [provider]  SPIRIVA RESPIMAT 2.5 MCG/ACT AERS Inhale 1 puff into the lungs daily. 05/30/18   [provider]  tiZANidine (ZANAFLEX) 4 MG tablet Take 4 mg by mouth at bedtime as needed for muscle spasms. 05/25/18   [provider]  triamterene-hydrochlorothiazide (DYAZIDE) 37.5-25 MG capsule Take 1 capsule by mouth daily.    [provider]    REVIEW OF SYSTEMS:  Review of Systems  Constitutional: Negative for chills, fever, malaise/fatigue and weight loss.  HENT: Negative for ear pain, hearing loss and tinnitus.   Eyes: Negative for blurred vision, double vision, pain and redness.  Respiratory: Positive for shortness of breath. Negative for cough and hemoptysis.   Cardiovascular: Positive for chest pain. Negative for palpitations, orthopnea and leg swelling.  Gastrointestinal: Negative for abdominal pain, constipation, diarrhea, nausea and vomiting.  Genitourinary: Negative for dysuria, frequency and hematuria.  Musculoskeletal: Negative for back pain, joint pain and neck pain.  Skin:       No acne, rash, or lesions  Neurological: Negative for dizziness, tremors, focal weakness and weakness.  Endo/Heme/Allergies: Negative for polydipsia. Does not bruise/bleed easily.  Psychiatric/Behavioral: Negative for depression. The patient is not nervous/anxious  and does not have insomnia.      VITAL SIGNS:   Vitals:   06/05/18 2130 06/05/18 2200 06/05/18 2230 06/05/18 2300  BP: 107/69 112/69 124/65 112/70  Pulse: 81 70 65 65  Resp: 17 18 18 20   Temp:      TempSrc:      SpO2: 99% 92% 93% 95%  Weight:      Height:       Wt Readings from Last 3 Encounters:  06/05/18 79.4 kg  08/07/17 81.6 kg  08/21/16 85.7 kg    PHYSICAL EXAMINATION:  Physical Exam  Vitals reviewed. Constitutional: She is oriented to person, place, and time. She appears well-developed and well-nourished. No distress.  HENT:  Head: Normocephalic and atraumatic.  Mouth/Throat: Oropharynx is clear and moist.  Eyes: Pupils are equal, round, and reactive to light. Conjunctivae and EOM are normal. No scleral icterus.  Neck: Normal range of motion. Neck supple. No JVD present. No thyromegaly present.  Cardiovascular:  Normal rate, regular rhythm and intact distal pulses. Exam reveals no gallop and no friction rub.  No murmur heard. Respiratory: Effort normal and breath sounds normal. No respiratory distress. She has no wheezes. She has no rales.  GI: Soft. Bowel sounds are normal. She exhibits no distension. There is no tenderness.  Musculoskeletal: Normal range of motion. She exhibits no edema.  No arthritis, no gout  Lymphadenopathy:    She has no cervical adenopathy.  Neurological: She is alert and oriented to person, place, and time. No cranial nerve deficit.  No dysarthria, no aphasia  Skin: Skin is warm and dry. No rash noted. No erythema.  Psychiatric: She has a normal mood and affect. Her behavior is normal. Judgment and thought content normal.    LABORATORY PANEL:   CBC Recent Labs  Lab 06/05/18 1634  WBC 9.6  HGB 13.8  HCT 41.6  PLT 256   ------------------------------------------------------------------------------------------------------------------  Chemistries  Recent Labs  Lab 06/05/18 1634  NA 132*  K 3.1*  CL 89*  CO2 32  GLUCOSE 112*   BUN 17  CREATININE 0.83  CALCIUM 9.1   ------------------------------------------------------------------------------------------------------------------  Cardiac Enzymes Recent Labs  Lab 06/05/18 2011  TROPONINI <0.03   ------------------------------------------------------------------------------------------------------------------  RADIOLOGY:  Dg Chest 2 View  Result Date: 06/05/2018 CLINICAL DATA:  Chest pain, shortness of Breath EXAM: CHEST - 2 VIEW COMPARISON:  02/29/2009 FINDINGS: Heart is normal size. No confluent airspace opacities, effusions or edema. No acute bony abnormality. Old left rib fractures are stable. IMPRESSION: No active cardiopulmonary disease. Electronically Signed   By: Rolm Baptise M.D.   On: 06/05/2018 17:14    EKG:   Orders placed or performed during the hospital encounter of 06/05/18  . ED EKG within 10 minutes  . ED EKG within 10 minutes    IMPRESSION AND PLAN:  Principal Problem:   Chest pain -finish cycling cardiac enzymes for minimum 3 total sets.  Get echocardiogram and cardiology consult in the morning.  Monitor on telemetry tonight Active Problems:   HTN (hypertension) -home dose antihypertensives   OSA (obstructive sleep apnea) -CPAP nightly   COPD (chronic obstructive pulmonary disease) (HCC) -home dose inhalers   HLD (hyperlipidemia) -Home dose antilipid   GERD (gastroesophageal reflux disease) -Home dose PPI  Chart review performed and case discussed with ED provider. Labs, imaging and/or ECG reviewed by provider and discussed with patient/family. Management plans discussed with the patient and/or family.  DVT PROPHYLAXIS: SubQ lovenox   GI PROPHYLAXIS:  PPI   ADMISSION STATUS: Observation  CODE STATUS: Full Code Status History    Date Active Date Inactive Code Status Order ID Comments User Context   08/21/2016 0943 08/21/2016 1437 Full Code [id number]  Isaias Cowman, MD Inpatient    Advance Directive Documentation      Most Recent Value  Type of Advance Directive  Healthcare Power of [place], Living will  Pre-existing out of facility DNR order (yellow form or pink MOST form)  -  "MOST" Form in Place?  -      TOTAL TIME TAKING CARE OF THIS PATIENT: 40 minutes.   Roman Sandall Kermit 06/05/2018, 11:36 PM  [facility]  Office  [phone]  CC: Primary care physician; Tracie Harrier, MD  Note:  This document was prepared using Dragon voice recognition software and may include unintentional dictation errors.

## 2018-06-05 NOTE — ED Triage Notes (Signed)
Pt arrives to ED with central CP that goes up throat. States started today. Near syncopal episode out in lobby. States SOB. Slow to answer. Appears to have to concentrate on breathing deep and slow. 3 person assist from wheelchair to bed.

## 2018-06-05 NOTE — ED Notes (Signed)
Answered pts call bell. Pt requesting to be unhooked from the monitor to use the bathroom. Pt with steady gait. Pt able to void. Pt returned to bed and hooked back up to the monitor. Pt given warm blanket for comfort. Call bell placed within reach.

## 2018-06-05 NOTE — ED Provider Notes (Signed)
[facility] Emergency Department Provider Note   ____________________________________________   I have reviewed the triage vital signs and the nursing notes.   HISTORY  Chief Complaint Chest Pain and Near Syncope   History limited by: Not Limited   HPI Lisa Cowan is a 77 y.o. female who presents to the emergency department today because of concern for chest pain. The pain started roughly 15 hours ago. Woke the patient from sleep. Located in the center chest and comes into her throat. She describes it as sharp. The pain has been fairly constant since it started. The patient did try taking antacids. She states that she has had similar pain in the past however not this intense. States that she did see a cardiologist for similar pain before. The patient denies any recent illness.    Per medical record review patient has a history of COPD, cardiac catheterization in 2019 which did not show significant disease.   Past Medical History:  Diagnosis Date  . Adrenal nodule ([place])   . Anemia   . Anginal pain ([place])   . Arthritis   . Cancer ([place])    cervical ca  . COPD (chronic obstructive pulmonary disease) ([place])   . Depression   . GERD (gastroesophageal reflux disease)   . Headache   . Hyperlipidemia   . Hypertension   . Skin cancer   . Sleep apnea     There are no active problems to display for this patient.   Past Surgical History:  Procedure Laterality Date  . ABDOMINAL HYSTERECTOMY    . ADENOIDECTOMY    . BREAST BIOPSY Right 01/04/2016   stereo  . CARDIAC CATHETERIZATION Left 08/21/2016   Procedure: Left Heart Cath and Coronary Angiography;  Surgeon: Isaias Cowman, MD;  Location: [facility];  Service: Cardiovascular;  Laterality: Left;  . COLONOSCOPY WITH PROPOFOL N/A 08/07/2017   Procedure: COLONOSCOPY WITH PROPOFOL;  Surgeon: Manya Silvas, MD;  Location: [facility] ENDOSCOPY;  Service: Endoscopy;  Laterality: N/A;  .  ESOPHAGOGASTRODUODENOSCOPY (EGD) WITH PROPOFOL N/A 08/07/2017   Procedure: ESOPHAGOGASTRODUODENOSCOPY (EGD) WITH PROPOFOL;  Surgeon: Manya Silvas, MD;  Location: [facility] ENDOSCOPY;  Service: Endoscopy;  Laterality: N/A;  . ROTATOR CUFF REPAIR Left   . TONSILLECTOMY      Prior to Admission medications   Medication Sig Start Date End Date Taking? Authorizing Provider  albuterol (PROVENTIL HFA;VENTOLIN HFA) 108 (90 Base) MCG/ACT inhaler Inhale 2 puffs into the lungs every 4 (four) hours as needed for wheezing or shortness of breath.    [provider]  atenolol (TENORMIN) 25 MG tablet Take 25 mg by mouth daily.    [provider]  azelastine (ASTELIN) 0.1 % nasal spray Place 1 spray into the nose 2 (two) times daily as needed for allergies.    [provider]  calcium-vitamin D (OSCAL WITH D) 250-125 MG-UNIT tablet Take 1 tablet by mouth daily.    [provider]  Cyanocobalamin 2500 MCG CHEW Chew 2,500 mcg by mouth daily.    [provider]  FLUoxetine (PROZAC) 20 MG capsule Take 20 mg by mouth daily.    [provider]  gabapentin (NEURONTIN) 300 MG capsule Take 300 mg by mouth 2 (two) times daily.    [provider]  meloxicam (MOBIC) 7.5 MG tablet Take 7.5 mg by mouth daily.    [provider]  methocarbamol (ROBAXIN) 500 MG tablet Take 500 mg by mouth every 8 (eight) hours as needed for muscle  spasms.    [provider]  mometasone (ELOCON) 0.1 % ointment Apply 1 application topically daily. 07/04/16   [provider]  omeprazole (PRILOSEC) 20 MG capsule Take 20 mg by mouth 2 (two) times daily before a meal.    [provider]  tiotropium (SPIRIVA) 18 MCG inhalation capsule Place 18 mcg into inhaler and inhale daily.    [provider]  triamterene-hydrochlorothiazide (DYAZIDE) 37.5-25 MG capsule Take 1 capsule by mouth daily.    [provider]    Allergies Codeine;  Hydrocodone-acetaminophen; Nucynta [tapentadol]; Oxycodone-acetaminophen; and Statins  Family History  Problem Relation Age of Onset  . Breast cancer Sister 39    Social History Social History   Tobacco Use  . Smoking status: Former Research scientist (life sciences)  . Smokeless tobacco: Never Used  Substance Use Topics  . Alcohol use: Yes    Comment: occassionally  . Drug use: No    Review of Systems Constitutional: No fever/chills Eyes: No visual changes. ENT: No sore throat. Cardiovascular: Positive for chest pain. Respiratory: Denies shortness of breath. Gastrointestinal: No abdominal pain.  No nausea, no vomiting.  No diarrhea.   Genitourinary: Negative for dysuria. Musculoskeletal: Negative for back pain. Skin: Negative for rash. Neurological: Negative for headaches, focal weakness or numbness.  ____________________________________________   PHYSICAL EXAM:  VITAL SIGNS: ED Triage Vitals  Enc Vitals Group     BP 06/05/18 1631 (!) 199/94     Pulse Rate 06/05/18 1631 66     Resp 06/05/18 1631 20     Temp 06/05/18 1631 (!) 97.5 F (36.4 C)     Temp Source 06/05/18 1631 Oral     SpO2 06/05/18 1631 98 %     Weight 06/05/18 1633 175 lb (79.4 kg)     Height 06/05/18 1633 4\' 11"  (1.499 m)     Head Circumference --      Peak Flow --      Pain Score 06/05/18 1632 6   Constitutional: Alert and oriented.  Eyes: Conjunctivae are normal.  ENT      Head: Normocephalic and atraumatic.      Nose: No congestion/rhinnorhea.      Mouth/Throat: Mucous membranes are moist.      Neck: No stridor. Hematological/Lymphatic/Immunilogical: No cervical lymphadenopathy. Cardiovascular: Normal rate, regular rhythm.  No murmurs, rubs, or gallops.  Respiratory: Normal respiratory effort without tachypnea nor retractions. Breath sounds are clear and equal bilaterally. No wheezes/rales/rhonchi. Gastrointestinal: Soft and non tender. No rebound. No guarding.  Genitourinary: Deferred Musculoskeletal: Normal  range of motion in all extremities. No lower extremity edema. Neurologic:  Normal speech and language. No gross focal neurologic deficits are appreciated.  Skin:  Skin is warm, dry and intact. No rash noted. Psychiatric: Mood and affect are normal. Speech and behavior are normal. Patient exhibits appropriate insight and judgment.  ____________________________________________    LABS (pertinent positives/negatives)  CBC wbc 9.6, hgb 13.8, plt 256 BMP na 132, k 3.1, glu 112 Trop <0.03 x 2 ____________________________________________   EKG  I, Nance Pear, attending physician, personally viewed and interpreted this EKG  EKG Time: 1630 Rate: 66 Rhythm: sinus rhythm Axis: left axis deviation Intervals: qtc 449 QRS: LAFB ST changes: no st elevation Impression: abnormal ekg  I, Nance Pear, attending physician, personally viewed and interpreted this EKG  EKG Time: 2122 Rate: 66 Rhythm: normal sinus rhythm Axis: left axis deviation Intervals: qtc 447 QRS: LAFB ST changes: no st elevation Impression: abnormal ekg   ____________________________________________    RADIOLOGY  CXR No acute disease  ____________________________________________   PROCEDURES  Procedures  ____________________________________________   INITIAL IMPRESSION / ASSESSMENT AND PLAN / ED COURSE  Pertinent labs & imaging results that were available during my care of the patient were reviewed by me and considered in my medical decision making (see chart for details).   Patient presented to the emergency department today because of concerns for chest pain.  Differential would be broad including pneumonia pneumothorax dissection PE ACS pericarditis costochondritis esophagitis amongst other etiologies.  Patient's chest x-ray without obvious concerning findings.  Blood work had troponin negative x2.  Patient however continued to have pain.  At this point unclear etiology.  I doubt PE given  lack of tachypnea, hypoxia or respiratory distress.  I doubt dissection given clinical history.  However given the patient continues to have pain will plan on admission for further work-up and evaluation.   ____________________________________________   FINAL CLINICAL IMPRESSION(S) / ED DIAGNOSES  Final diagnoses:  Atypical chest pain     Note: This dictation was prepared with Dragon dictation. Any transcriptional errors that result from this process are unintentional     Nance Pear, MD 06/05/18 2300

## 2018-06-05 NOTE — ED Notes (Signed)
Patient transported to X-ray 

## 2018-06-05 NOTE — ED Notes (Signed)
Pt given crackers and water at this time.

## 2018-06-06 ENCOUNTER — Other Ambulatory Visit: Payer: Self-pay

## 2018-06-06 ENCOUNTER — Observation Stay
Admit: 2018-06-06 | Discharge: 2018-06-06 | Disposition: A | Discharge status: Home / Self Care | Payer: PPO | Attending: Internal Medicine | Admitting: Internal Medicine

## 2018-06-06 DIAGNOSIS — G4733 Obstructive sleep apnea (adult) (pediatric): Secondary | ICD-10-CM | POA: Diagnosis not present

## 2018-06-06 DIAGNOSIS — R079 Chest pain, unspecified: Secondary | ICD-10-CM | POA: Diagnosis not present

## 2018-06-06 DIAGNOSIS — I10 Essential (primary) hypertension: Secondary | ICD-10-CM | POA: Diagnosis not present

## 2018-06-06 DIAGNOSIS — J449 Chronic obstructive pulmonary disease, unspecified: Secondary | ICD-10-CM | POA: Diagnosis not present

## 2018-06-06 LAB — BASIC METABOLIC PANEL
ANION GAP: 9 (ref 5–15)
BUN: 13 mg/dL (ref 8–23)
CHLORIDE: 92 mmol/L — AB (ref 98–111)
CO2: 31 mmol/L (ref 22–32)
Calcium: 8.7 mg/dL — ABNORMAL LOW (ref 8.9–10.3)
Creatinine, Ser: 0.78 mg/dL (ref 0.44–1.00)
GFR calc non Af Amer: >60 mL/min (ref >60)
Glucose, Bld: 110 mg/dL — ABNORMAL HIGH (ref 70–99)
POTASSIUM: 3 mmol/L — AB (ref 3.5–5.1)
Sodium: 132 mmol/L — ABNORMAL LOW (ref 135–145)

## 2018-06-06 LAB — CBC
HEMATOCRIT: 38.9 % (ref 36.0–46.0)
Hemoglobin: 13.1 g/dL (ref 12.0–15.0)
MCH: 28.9 pg (ref 26.0–34.0)
MCHC: 33.7 g/dL (ref 30.0–36.0)
MCV: 85.9 fL (ref 80.0–100.0)
PLATELETS: 239 10*3/uL (ref 150–400)
RBC: 4.53 MIL/uL (ref 3.87–5.11)
RDW: 13.9 % (ref 11.5–15.5)
WBC: 7 10*3/uL (ref 4.0–10.5)
nRBC: 0 % (ref 0.0–0.2)

## 2018-06-06 LAB — ECHOCARDIOGRAM COMPLETE
Height: 59 in
Weight: 2868.8 oz

## 2018-06-06 LAB — TROPONIN I: Troponin I: <0.03 ng/mL (ref <0.03)

## 2018-06-06 MED ORDER — POTASSIUM CHLORIDE CRYS ER 20 MEQ PO TBCR
40.0000 meq | EXTENDED_RELEASE_TABLET | ORAL | Status: AC
  Administered 2018-06-06 (×2): 40 meq via ORAL
  Filled 2018-06-06 (×2): qty 2

## 2018-06-06 MED ORDER — TIOTROPIUM BROMIDE MONOHYDRATE 18 MCG IN CAPS
1.0000 | ORAL_CAPSULE | Freq: Every day | RESPIRATORY_TRACT | Status: DC
  Administered 2018-06-06 – 2018-06-07 (×2): 18 ug via RESPIRATORY_TRACT
  Filled 2018-06-06: qty 5

## 2018-06-06 MED ORDER — ATENOLOL 25 MG PO TABS
25.0000 mg | ORAL_TABLET | Freq: Every day | ORAL | Status: DC
  Administered 2018-06-06 – 2018-06-07 (×2): 25 mg via ORAL
  Filled 2018-06-06 (×2): qty 1

## 2018-06-06 MED ORDER — ACETAMINOPHEN 650 MG RE SUPP: 650.0000 mg | Freq: Four times a day (QID) | RECTAL | Status: DC | PRN

## 2018-06-06 MED ORDER — GABAPENTIN 300 MG PO CAPS
300.0000 mg | ORAL_CAPSULE | Freq: Two times a day (BID) | ORAL | Status: DC
  Administered 2018-06-06 (×2): 300 mg via ORAL
  Filled 2018-06-06 (×2): qty 1

## 2018-06-06 MED ORDER — ONDANSETRON HCL 4 MG/2ML IJ SOLN: 4.0000 mg | Freq: Four times a day (QID) | INTRAMUSCULAR | Status: DC | PRN

## 2018-06-06 MED ORDER — ROSUVASTATIN CALCIUM 5 MG PO TABS
5.0000 mg | ORAL_TABLET | ORAL | Status: DC
  Administered 2018-06-07: 5 mg via ORAL
  Filled 2018-06-06: qty 1

## 2018-06-06 MED ORDER — ONDANSETRON HCL 4 MG PO TABS: 4.0000 mg | ORAL_TABLET | Freq: Four times a day (QID) | ORAL | Status: DC | PRN

## 2018-06-06 MED ORDER — ACETAMINOPHEN 325 MG PO TABS
650.0000 mg | ORAL_TABLET | Freq: Four times a day (QID) | ORAL | Status: DC | PRN
  Administered 2018-06-06 (×2): 650 mg via ORAL
  Filled 2018-06-06 (×2): qty 2

## 2018-06-06 MED ORDER — FLUOXETINE HCL 20 MG PO CAPS
20.0000 mg | ORAL_CAPSULE | Freq: Every day | ORAL | Status: DC
  Administered 2018-06-06 – 2018-06-07 (×2): 20 mg via ORAL
  Filled 2018-06-06 (×2): qty 1

## 2018-06-06 MED ORDER — TRAMADOL HCL 50 MG PO TABS: 50.0000 mg | ORAL_TABLET | Freq: Four times a day (QID) | ORAL | Status: DC | PRN

## 2018-06-06 MED ORDER — PANTOPRAZOLE SODIUM 40 MG PO TBEC
40.0000 mg | DELAYED_RELEASE_TABLET | Freq: Every day | ORAL | Status: DC
  Administered 2018-06-06 – 2018-06-07 (×2): 40 mg via ORAL
  Filled 2018-06-06 (×2): qty 1

## 2018-06-06 MED ORDER — ENOXAPARIN SODIUM 40 MG/0.4ML ~~LOC~~ SOLN
40.0000 mg | SUBCUTANEOUS | Status: DC
  Administered 2018-06-06: 40 mg via SUBCUTANEOUS
  Filled 2018-06-06: qty 0.4

## 2018-06-06 NOTE — Consult Note (Signed)
Cardiology Consultation Note    Patient ID: Lisa Cowan, MRN: [id number], DOB/AGE: 23-Apr-1941 77 y.o. Admit date: 06/05/2018   Date of Consult: 06/06/2018 Primary Physician: Tracie Harrier, MD Primary Cardiologist: Dr. Saralyn Pilar  Chief Complaint: chest pain Reason for Consultation: chest pain Requesting MD: Dr. Darvin Neighbours  HPI: Lisa Cowan is a 77 y.o. female with history of PAD, hypertension, hyperlipidemia and insignificant coronary artery disease.  She was evaluated both invasively or noninvasively with a 2D echocardiogram 08/11/2016 showed LVEF >55% with no wall motion abnormalities. Myoview ETT revealed no EKG changes, normal LV function, normal wall motion, mild apical wall ischemia. The patient underwent cardiac catheterization on 08/21/2016 which revealed insignificant coronary artery disease, with 30-40% stenosis LAD, and left circumflex with normal left ventricular function.  She now presents with chest discomfort.  Echocardiogram is pending.  She has ruled out for myocardial infarction.  Electrocardiogram revealed sinus rhythm at a rate of 66 with a QTC of 449 ms left anterior fascicular block with no ischemic changes.  Currently feels better.  She is currently treated with atenolol 25 mg daily, rosuvastatin 5 mg daily.  She is hemodynamically stable.  Chest x-ray revealed no acute cardiopulmonary disease   Past Medical History:  Diagnosis Date  . Adrenal nodule ([place])   . Anemia   . Anginal pain ([place])   . Arthritis   . Cancer ([place])    cervical ca  . COPD (chronic obstructive pulmonary disease) ([place])   . Depression   . GERD (gastroesophageal reflux disease)   . Headache   . Hyperlipidemia   . Hypertension   . Skin cancer   . Sleep apnea       Surgical History:  Past Surgical History:  Procedure Laterality Date  . ABDOMINAL HYSTERECTOMY    . ADENOIDECTOMY    . BREAST BIOPSY Right 01/04/2016   stereo  . CARDIAC CATHETERIZATION Left 08/21/2016   Procedure: Left  Heart Cath and Coronary Angiography;  Surgeon: Isaias Cowman, MD;  Location: [facility];  Service: Cardiovascular;  Laterality: Left;  . COLONOSCOPY WITH PROPOFOL N/A 08/07/2017   Procedure: COLONOSCOPY WITH PROPOFOL;  Surgeon: Manya Silvas, MD;  Location[facility] ENDOSCOPY;  Service: Endoscopy;  Laterality: N/A;  . ESOPHAGOGASTRODUODENOSCOPY (EGD) WITH PROPOFOL N/A 08/07/2017   Procedure: ESOPHAGOGASTRODUODENOSCOPY (EGD) WITH PROPOFOL;  Surgeon: Manya Silvas, MD;  Location: [facility] ENDOSCOPY;  Service: Endoscopy;  Laterality: N/A;  . ROTATOR CUFF REPAIR Left   . TONSILLECTOMY       Home Meds: Prior to Admission medications   Medication Sig Start Date End Date Taking? Authorizing Provider  albuterol (PROVENTIL HFA;VENTOLIN HFA) 108 (90 Base) MCG/ACT inhaler Inhale 2 puffs into the lungs every 4 (four) hours as needed for wheezing or shortness of breath.   Yes [provider]  atenolol (TENORMIN) 25 MG tablet Take 25 mg by mouth daily.   Yes [provider]  Cyanocobalamin 2500 MCG CHEW Chew 2,500 mcg by mouth daily.   Yes [provider]  FLUoxetine (PROZAC) 20 MG capsule Take 20 mg by mouth daily.   Yes [provider]  gabapentin (NEURONTIN) 300 MG capsule Take 300 mg by mouth 2 (two) times daily.   Yes [provider]  meloxicam (MOBIC) 7.5 MG tablet Take 7.5 mg by mouth daily.   Yes [provider]  Multiple Vitamin (MULTIVITAMIN WITH MINERALS) TABS tablet Take 1 tablet by mouth daily.   Yes [provider]  omeprazole (PRILOSEC) 20 MG capsule  Take 20 mg by mouth 2 (two) times daily before a meal.   Yes [provider]  rosuvastatin (CRESTOR) 5 MG tablet Take 5 mg by mouth every Monday, Wednesday, and Friday.  03/17/18  Yes [provider]  SPIRIVA RESPIMAT 2.5 MCG/ACT AERS Inhale 1 puff into the lungs daily. 05/30/18  Yes [provider]  tiZANidine (ZANAFLEX) 4 MG tablet Take 4 mg by  mouth at bedtime as needed for muscle spasms. 05/25/18  Yes [provider]  triamterene-hydrochlorothiazide (DYAZIDE) 37.5-25 MG capsule Take 1 capsule by mouth daily.   Yes [provider]    Inpatient Medications:  . atenolol  25 mg Oral Daily  . enoxaparin (LOVENOX) injection  40 mg Subcutaneous Q24H  . FLUoxetine  20 mg Oral Daily  . gabapentin  300 mg Oral BID  . pantoprazole  40 mg Oral Daily  . potassium chloride  40 mEq Oral Q4H  . [START ON 06/07/2018] rosuvastatin  5 mg Oral Q M,W,F  . tiotropium  1 capsule Inhalation Daily     Allergies:  Allergies  Allergen Reactions  . Codeine Itching and Other (See Comments)    hallucinations  . Hydrocodone-Acetaminophen Itching and Other (See Comments)    hallucinations  . Nucynta [Tapentadol] Itching and Other (See Comments)    hallucinations  . Oxycodone-Acetaminophen Itching and Other (See Comments)    hallucinations  . Statins     Social History   Socioeconomic History  . Marital status: Married    Spouse name: Not on file  . Number of children: Not on file  . Years of education: Not on file  . Highest education level: Not on file  Occupational History  . Not on file  Social Needs  . Financial resource strain: Not on file  . Food insecurity:    Worry: Not on file    Inability: Not on file  . Transportation needs:    Medical: Not on file    Non-medical: Not on file  Tobacco Use  . Smoking status: Former Research scientist (life sciences)  . Smokeless tobacco: Never Used  Substance and Sexual Activity  . Alcohol use: Yes    Comment: occassionally  . Drug use: No  . Sexual activity: Not on file  Lifestyle  . Physical activity:    Days per week: Not on file    Minutes per session: Not on file  . Stress: Not on file  Relationships  . Social connections:    Talks on phone: Not on file    Gets together: Not on file    Attends religious service: Not on file    Active member of club or organization: Not on file     Attends meetings of clubs or organizations: Not on file    Relationship status: Not on file  . Intimate partner violence:    Fear of current or ex partner: Not on file    Emotionally abused: Not on file    Physically abused: Not on file    Forced sexual activity: Not on file  Other Topics Concern  . Not on file  Social History Narrative  . Not on file     Family History  Problem Relation Age of Onset  . Breast cancer Sister 30     Review of Systems: A 12-system review of systems was performed and is negative except as noted in the HPI.  Labs: Recent Labs    06/05/18 1634 06/05/18 2011 06/06/18 0158  TROPONINI <0.03 <0.03 <0.03  Lab Results  Component Value Date   WBC 7.0 06/06/2018   HGB 13.1 06/06/2018   HCT 38.9 06/06/2018   MCV 85.9 06/06/2018   PLT 239 06/06/2018    Recent Labs  Lab 06/06/18 0158  NA 132*  K 3.0*  CL 92*  CO2 31  BUN 13  CREATININE 0.78  CALCIUM 8.7*  GLUCOSE 110*   No results found for: CHOL, HDL, LDLCALC, TRIG No results found for: DDIMER  Radiology/Studies:  Dg Chest 2 View  Result Date: 06/05/2018 CLINICAL DATA:  Chest pain, shortness of Breath EXAM: CHEST - 2 VIEW COMPARISON:  02/29/2009 FINDINGS: Heart is normal size. No confluent airspace opacities, effusions or edema. No acute bony abnormality. Old left rib fractures are stable. IMPRESSION: No active cardiopulmonary disease. Electronically Signed   By: Rolm Baptise M.D.   On: 06/05/2018 17:14    Wt Readings from Last 3 Encounters:  06/06/18 81.3 kg  08/07/17 81.6 kg  08/21/16 85.7 kg    EKG: Sinus rhythm with left anterior fascicular block no ischemia  Physical Exam:  Blood pressure 125/71, pulse 67, temperature 97.7 F (36.5 C), temperature source Oral, resp. rate 19, height 4\' 11"  (1.499 m), weight 81.3 kg, SpO2 93 %. Body mass index is 36.21 kg/m. General: Well developed, well nourished, in no acute distress. Head: Normocephalic, atraumatic, sclera non-icteric,  no xanthomas, nares are without discharge.  Neck: Negative for carotid bruits. JVD not elevated. Lungs: Clear bilaterally to auscultation without wheezes, rales, or rhonchi. Breathing is unlabored. Heart: RRR with S1 S2. No murmurs, rubs, or gallops appreciated. Abdomen: Soft, non-tender, non-distended with normoactive bowel sounds. No hepatomegaly. No rebound/guarding. No obvious abdominal masses. Msk:  Strength and tone appear normal for age. Extremities: No clubbing or cyanosis. No edema.  Distal pedal pulses are 2+ and equal bilaterally. Neuro: Alert and oriented X 3. No facial asymmetry. No focal deficit. Moves all extremities spontaneously. Psych:  Responds to questions appropriately with a normal affect.     Assessment and Plan  Patient with history of COPD, hypertension, hyperlipidemia and chest pain.  Presented with chest pain.  Is ruled out for myocardial infarction.  Cardiac catheterization one 1/2 year ago revealed insignificant disease.  His recent functional study was in January 2018.  Will review echo when available and consider proceeding with a functional study in the morning to evaluate for progression of her LAD disease.  If this is abnormal would consider a relookcardiac cath.  Signed, Teodoro Spray MD 06/06/2018, 11:12 AM Pager: [phone]

## 2018-06-06 NOTE — Progress Notes (Signed)
RT checked pt's cpap.

## 2018-06-06 NOTE — Care Management Obs Status (Signed)
[place] NOTIFICATION   Patient Details  Name: DEATRA MCMAHEN MRN: [id number] Date of Birth: 10-02-1940   Medicare Observation Status Notification Given:  Yes    Shari Natt A Ramonica Grigg, RN 06/06/2018, 11:28 AM

## 2018-06-06 NOTE — Progress Notes (Signed)
[facility] [place] NAME: Lisa Cowan    MR#:  [id number]  DATE OF BIRTH:  05-Aug-1940  SUBJECTIVE:  CHIEF COMPLAINT:   Chief Complaint  Patient presents with  . Chest Pain  . Near Syncope   No chest pain today.  REVIEW OF SYSTEMS:    Review of Systems  Constitutional: Positive for malaise/fatigue. Negative for chills and fever.  HENT: Negative for sore throat.   Eyes: Negative for blurred vision, double vision and pain.  Respiratory: Negative for cough, hemoptysis, shortness of breath and wheezing.   Cardiovascular: Negative for chest pain, palpitations, orthopnea and leg swelling.  Gastrointestinal: Negative for abdominal pain, constipation, diarrhea, heartburn, nausea and vomiting.  Genitourinary: Negative for dysuria and hematuria.  Musculoskeletal: Negative for back pain and joint pain.  Skin: Negative for rash.  Neurological: Negative for sensory change, speech change, focal weakness and headaches.  Endo/Heme/Allergies: Does not bruise/bleed easily.  Psychiatric/Behavioral: Negative for depression. The patient is not nervous/anxious.     DRUG ALLERGIES:   Allergies  Allergen Reactions  . Codeine Itching and Other (See Comments)    hallucinations  . Hydrocodone-Acetaminophen Itching and Other (See Comments)    hallucinations  . Nucynta [Tapentadol] Itching and Other (See Comments)    hallucinations  . Oxycodone-Acetaminophen Itching and Other (See Comments)    hallucinations  . Statins     VITALS:  Blood pressure 125/71, pulse 67, temperature 97.7 F (36.5 C), temperature source Oral, resp. rate 19, height 4\' 11"  (1.499 m), weight 81.3 kg, SpO2 93 %.  PHYSICAL EXAMINATION:   Physical Exam  GENERAL:  77 y.o.-year-old patient lying in the bed with no acute distress.  EYES: Pupils equal, round, reactive to light and accommodation. No scleral icterus. Extraocular muscles intact.  HEENT: Head atraumatic,  normocephalic. Oropharynx and nasopharynx clear.  NECK:  Supple, no jugular venous distention. No thyroid enlargement, no tenderness.  LUNGS: Normal breath sounds bilaterally, no wheezing, rales, rhonchi. No use of accessory muscles of respiration.  CARDIOVASCULAR: S1, S2 normal. No murmurs, rubs, or gallops.  ABDOMEN: Soft, nontender, nondistended. Bowel sounds present. No organomegaly or mass.  EXTREMITIES: No cyanosis, clubbing or edema b/l.    NEUROLOGIC: Cranial nerves II through XII are intact. No focal Motor or sensory deficits b/l.   PSYCHIATRIC: The patient is alert and oriented x 3.  SKIN: No obvious rash, lesion, or ulcer.   LABORATORY PANEL:   CBC Recent Labs  Lab 06/06/18 0158  WBC 7.0  HGB 13.1  HCT 38.9  PLT 239   ------------------------------------------------------------------------------------------------------------------ Chemistries  Recent Labs  Lab 06/06/18 0158  NA 132*  K 3.0*  CL 92*  CO2 31  GLUCOSE 110*  BUN 13  CREATININE 0.78  CALCIUM 8.7*   ------------------------------------------------------------------------------------------------------------------  Cardiac Enzymes Recent Labs  Lab 06/06/18 0158  TROPONINI <0.03   ------------------------------------------------------------------------------------------------------------------  RADIOLOGY:  Dg Chest 2 View  Result Date: 06/05/2018 CLINICAL DATA:  Chest pain, shortness of Breath EXAM: CHEST - 2 VIEW COMPARISON:  02/29/2009 FINDINGS: Heart is normal size. No confluent airspace opacities, effusions or edema. No acute bony abnormality. Old left rib fractures are stable. IMPRESSION: No active cardiopulmonary disease. Electronically Signed   By: Rolm Baptise M.D.   On: 06/05/2018 17:14     ASSESSMENT AND PLAN:    * Chest pain  Troponin stable Stress test in AM Discussed with Dr. Ubaldo Glassing   * HTN (hypertension)  Antihypertensives   * OSA (obstructive sleep apnea) -CPAP  nightly     * COPD (chronic obstructive pulmonary disease) (HCC) -home dose inhalers Nebs PRN   *  HLD (hyperlipidemia) -Home dose antilipid   *  GERD (gastroesophageal reflux disease) -Home dose PPI  All the records are reviewed and case discussed with Care Management/Social Worker Management plans discussed with the patient, family and they are in agreement.  CODE STATUS: FULL CODE  DVT Prophylaxis: SCDs  TOTAL TIME TAKING CARE OF THIS PATIENT: 35 minutes.   POSSIBLE D/C IN 1-2 DAYS, DEPENDING ON CLINICAL CONDITION.  Lisa Cowan M.D on 06/06/2018 at 1:18 PM  Between 7am to 6pm - Pager - [phone]  After 6pm go to [email or web address] - password [email or web address] [facility]  Office  [phone]  CC: Primary care physician; Tracie Harrier, MD  Note: This dictation was prepared with Dragon dictation along with smaller phrase technology. Any transcriptional errors that result from this process are unintentional.

## 2018-06-06 NOTE — Plan of Care (Signed)
  Problem: Health Behavior/Discharge Planning: Goal: Ability to manage health-related needs will improve Outcome: Progressing Note:  Patient for a nuclear stress test in the morning. Consent has been obtained. Will continue to monitor. Wenda Low [facility] [place]

## 2018-06-07 ENCOUNTER — Observation Stay: Payer: PPO

## 2018-06-07 ENCOUNTER — Ambulatory Visit: Payer: PPO

## 2018-06-07 ENCOUNTER — Encounter: Payer: Self-pay | Admitting: *Deleted

## 2018-06-07 DIAGNOSIS — G4733 Obstructive sleep apnea (adult) (pediatric): Secondary | ICD-10-CM | POA: Diagnosis not present

## 2018-06-07 DIAGNOSIS — R0602 Shortness of breath: Secondary | ICD-10-CM | POA: Diagnosis not present

## 2018-06-07 DIAGNOSIS — I10 Essential (primary) hypertension: Secondary | ICD-10-CM | POA: Diagnosis not present

## 2018-06-07 DIAGNOSIS — K219 Gastro-esophageal reflux disease without esophagitis: Secondary | ICD-10-CM | POA: Diagnosis not present

## 2018-06-07 DIAGNOSIS — I25119 Atherosclerotic heart disease of native coronary artery with unspecified angina pectoris: Secondary | ICD-10-CM | POA: Diagnosis not present

## 2018-06-07 DIAGNOSIS — J449 Chronic obstructive pulmonary disease, unspecified: Secondary | ICD-10-CM | POA: Diagnosis not present

## 2018-06-07 DIAGNOSIS — R0789 Other chest pain: Secondary | ICD-10-CM | POA: Diagnosis not present

## 2018-06-07 LAB — NM MYOCAR MULTI W/SPECT W/WALL MOTION / EF
CSEPED: 1 min
CSEPHR: 69 %
CSEPPHR: 100 {beats}/min
Estimated workload: 1 METS
Exercise duration (sec): 1 s
LV sys vol: 9 mL
LVDIAVOL: 32 mL (ref 46–106)
MPHR: 144 {beats}/min
Rest HR: 70 {beats}/min
SDS: 2
SRS: 10
SSS: 11
TID: 0.89

## 2018-06-07 LAB — FIBRIN DERIVATIVES D-DIMER (ARMC ONLY): Fibrin derivatives D-dimer (ARMC): 1311.57 ng/mL (FEU) — ABNORMAL HIGH (ref 0.00–499.00)

## 2018-06-07 LAB — BASIC METABOLIC PANEL
Anion gap: 10 (ref 5–15)
BUN: 12 mg/dL (ref 8–23)
CALCIUM: 9 mg/dL (ref 8.9–10.3)
CHLORIDE: 95 mmol/L — AB (ref 98–111)
CO2: 30 mmol/L (ref 22–32)
CREATININE: 0.81 mg/dL (ref 0.44–1.00)
GFR calc Af Amer: >60 mL/min (ref >60)
GFR calc non Af Amer: >60 mL/min (ref >60)
Glucose, Bld: 114 mg/dL — ABNORMAL HIGH (ref 70–99)
Potassium: 4.3 mmol/L (ref 3.5–5.1)
SODIUM: 135 mmol/L (ref 135–145)

## 2018-06-07 MED ORDER — TECHNETIUM TC 99M TETROFOSMIN IV KIT
30.7500 | PACK | Freq: Once | INTRAVENOUS | Status: AC | PRN
  Administered 2018-06-07: 30.75 via INTRAVENOUS

## 2018-06-07 MED ORDER — REGADENOSON 0.4 MG/5ML IV SOLN
0.4000 mg | Freq: Once | INTRAVENOUS | Status: AC
  Administered 2018-06-07: 0.4 mg via INTRAVENOUS

## 2018-06-07 MED ORDER — IOHEXOL 350 MG/ML SOLN
75.0000 mL | Freq: Once | INTRAVENOUS | Status: AC | PRN
  Administered 2018-06-07: 75 mL via INTRAVENOUS

## 2018-06-07 MED ORDER — TECHNETIUM TC 99M TETROFOSMIN IV KIT
10.0000 | PACK | Freq: Once | INTRAVENOUS | Status: AC | PRN
  Administered 2018-06-07: 10.42 via INTRAVENOUS

## 2018-06-07 NOTE — Discharge Summary (Signed)
[facility] [place] NAME: Arda Daggs    MR#:  [id number]  DATE OF BIRTH:  [birth year]  DATE OF ADMISSION:  06/05/2018   ADMITTING PHYSICIAN: Lance Coon, MD  DATE OF DISCHARGE: 06/07/18  PRIMARY CARE PHYSICIAN: Tracie Harrier, MD   ADMISSION DIAGNOSIS:   Atypical chest pain [R07.89]  DISCHARGE DIAGNOSIS:   Principal Problem:   Chest pain Active Problems:   HTN (hypertension)   HLD (hyperlipidemia)   OSA (obstructive sleep apnea)   GERD (gastroesophageal reflux disease)   COPD (chronic obstructive pulmonary disease) ([place])   SECONDARY DIAGNOSIS:   Past Medical History:  Diagnosis Date  . Adrenal nodule ([place])   . Anemia   . Anginal pain ([place])   . Arthritis   . Cancer ([place])    cervical ca  . COPD (chronic obstructive pulmonary disease) ([place])   . Depression   . GERD (gastroesophageal reflux disease)   . Headache   . Hyperlipidemia   . Hypertension   . Skin cancer   . Sleep apnea     HOSPITAL COURSE:   77 year old female with past medical history significant for GERD, history of cervical cancer, hypertension, hyperlipidemia presents to hospital secondary to chest pain or shortness of breath  1.  Chest pain-after further discussion, seems like GI symptoms -However due to episode of dyspnea and chest pain prior to presentation, and elevated d-dimer- CT angio chest done- it is negative for PE - shows a small 26mm nodule in right middle lobe-further follow-up in 6 months with a repeat CT recommended. - Troponins are negative - patient had a stress test and that did not show any reversible causes of ischemia.. -Cardiac cath with minimal coronary artery disease and stress test negative last year. -Appreciate cardiology consult. -Patient already had a EGD done January 2019 that showed normal stomach -Increase Prilosec to twice a day  2. HTN- atenolol  3. GERD-Prilosec- increase to twice a day at  discharge  4. Neuropathy- neurontin   Independent and ambulatory at baseline  DISCHARGE CONDITIONS:   Guarded\  CONSULTS OBTAINED:   Treatment Team:  Teodoro Spray, MD  DRUG ALLERGIES:   Allergies  Allergen Reactions  . Codeine Itching and Other (See Comments)    hallucinations  . Hydrocodone-Acetaminophen Itching and Other (See Comments)    hallucinations  . Nucynta [Tapentadol] Itching and Other (See Comments)    hallucinations  . Oxycodone-Acetaminophen Itching and Other (See Comments)    hallucinations  . Statins    DISCHARGE MEDICATIONS:   Allergies as of 06/07/2018      Reactions   Codeine Itching, Other (See Comments)   hallucinations   Hydrocodone-acetaminophen Itching, Other (See Comments)   hallucinations   Nucynta [tapentadol] Itching, Other (See Comments)   hallucinations   Oxycodone-acetaminophen Itching, Other (See Comments)   hallucinations   Statins       Medication List    STOP taking these medications   triamterene-hydrochlorothiazide 37.5-25 MG capsule Commonly known as:  DYAZIDE     TAKE these medications   albuterol 108 (90 Base) MCG/ACT inhaler Commonly known as:  PROVENTIL HFA;VENTOLIN HFA Inhale 2 puffs into the lungs every 4 (four) hours as needed for wheezing or shortness of breath.   atenolol 25 MG tablet Commonly known as:  TENORMIN Take 25 mg by mouth daily.   Cyanocobalamin 2500 MCG Chew Chew 2,500 mcg by mouth daily.   FLUoxetine 20 MG capsule Commonly known as:  PROZAC Take  20 mg by mouth daily.   gabapentin 300 MG capsule Commonly known as:  NEURONTIN Take 300 mg by mouth 2 (two) times daily.   meloxicam 7.5 MG tablet Commonly known as:  MOBIC Take 7.5 mg by mouth daily.   multivitamin with minerals Tabs tablet Take 1 tablet by mouth daily.   omeprazole 20 MG capsule Commonly known as:  PRILOSEC Take 20 mg by mouth 2 (two) times daily before a meal.   rosuvastatin 5 MG tablet Commonly known as:   CRESTOR Take 5 mg by mouth every Monday, Wednesday, and Friday.   SPIRIVA RESPIMAT 2.5 MCG/ACT Aers Generic drug:  Tiotropium Bromide Monohydrate Inhale 1 puff into the lungs daily.   tiZANidine 4 MG tablet Commonly known as:  ZANAFLEX Take 4 mg by mouth at bedtime as needed for muscle spasms.        DISCHARGE INSTRUCTIONS:   1. PCP f/u in 1-2 weeks  DIET:   Cardiac diet  ACTIVITY:   Activity as tolerated  OXYGEN:   Home Oxygen: No.  Oxygen Delivery: room air  DISCHARGE LOCATION:   home   If you experience worsening of your admission symptoms, develop shortness of breath, life threatening emergency, suicidal or homicidal thoughts you must seek medical attention immediately by calling 911 or calling your MD immediately  if symptoms less severe.  You Must read complete instructions/literature along with all the possible adverse reactions/side effects for all the Medicines you take and that have been prescribed to you. Take any new Medicines after you have completely understood and accpet all the possible adverse reactions/side effects.   Please note  You were cared for by a hospitalist during your hospital stay. If you have any questions about your discharge medications or the care you received while you were in the hospital after you are discharged, you can call the unit and asked to speak with the hospitalist on call if the hospitalist that took care of you is not available. Once you are discharged, your primary care physician will handle any further medical issues. Please note that NO REFILLS for any discharge medications will be authorized once you are discharged, as it is imperative that you return to your primary care physician (or establish a relationship with a primary care physician if you do not have one) for your aftercare needs so that they can reassess your need for medications and monitor your lab values.    On the day of Discharge:  VITAL SIGNS:   Blood  pressure 125/70, pulse 67, temperature 97.8 F (36.6 C), temperature source Oral, resp. rate 18, height 4\' 11"  (1.499 m), weight 81.3 kg, SpO2 94 %.  PHYSICAL EXAMINATION:     GENERAL:  77 y.o.-year-old patient lying in the bed with no acute distress.  EYES: Pupils equal, round, reactive to light and accommodation. No scleral icterus. Extraocular muscles intact.  HEENT: Head atraumatic, normocephalic. Oropharynx and nasopharynx clear.  NECK:  Supple, no jugular venous distention. No thyroid enlargement, no tenderness.  LUNGS: Normal breath sounds bilaterally, no wheezing, rales,rhonchi or crepitation. No use of accessory muscles of respiration.  Decreased bibasilar breath sounds CARDIOVASCULAR: S1, S2 normal. No  rubs, or gallops.  2/6 systolic murmur is present ABDOMEN: Soft, nontender, nondistended. Bowel sounds present. No organomegaly or mass.  EXTREMITIES: No pedal edema, cyanosis, or clubbing.  NEUROLOGIC: Cranial nerves II through XII are intact. Muscle strength 5/5 in all extremities. Sensation intact. Gait not checked.  PSYCHIATRIC: The patient is alert and oriented x  3.  SKIN: No obvious rash, lesion, or ulcer.   DATA REVIEW:   CBC Recent Labs  Lab 06/06/18 0158  WBC 7.0  HGB 13.1  HCT 38.9  PLT 239    Chemistries  Recent Labs  Lab 06/07/18 0810  NA 135  K 4.3  CL 95*  CO2 30  GLUCOSE 114*  BUN 12  CREATININE 0.81  CALCIUM 9.0     Microbiology Results  No results found for this or any previous visit.  RADIOLOGY:  Ct Angio Chest Pe W Or Wo Contrast  Result Date: 06/07/2018 CLINICAL DATA:  Chest pain and shortness of breath.  Near syncope. EXAM: CT ANGIOGRAPHY CHEST WITH CONTRAST TECHNIQUE: Multidetector CT imaging of the chest was performed using the standard protocol during bolus administration of intravenous contrast. Multiplanar CT image reconstructions and MIPs were obtained to evaluate the vascular anatomy. CONTRAST:  22mL OMNIPAQUE IOHEXOL 350 MG/ML  SOLN COMPARISON:  Radiography 06/05/2018 FINDINGS: Cardiovascular: Pulmonary arterial opacification is excellent. There are no pulmonary emboli. There is aortic atherosclerosis. No sign of aneurysm or dissection. Heart size is normal. There is coronary artery calcification. Mediastinum/Nodes: No mass or lymphadenopathy. Lungs/Pleura: Emphysema. 7 mm scar or sub solid nodule in the right upper lobe. 7 mm nodule in the right middle lobe. Upper Abdomen: 2 cm low-density nodule of the right adrenal gland likely a benign adenoma, a chronic finding. Musculoskeletal: Ordinary spinal degenerative changes. Review of the MIP images confirms the above findings. IMPRESSION: No pulmonary emboli or acute chest pathology by CT. Aortic atherosclerosis.  Coronary artery calcification. Emphysema. 7 mm pulmonary nodule right middle lobe. 7 mm scar or sub solid nodule in the right upper lobe. Follow-up non-contrast CT recommended at 3-6 months to confirm persistence. If unchanged, and solid component remains <6 mm, annual CT is recommended until 5 years of stability has been established. If persistent these nodules should be considered highly suspicious if the solid component of the nodule is 6 mm or greater in size and enlarging. This recommendation follows the consensus statement: Guidelines for Management of Incidental Pulmonary Nodules Detected on CT Images: From the Fleischner Society 2017; Radiology 2017; 284:228-243. Electronically Signed   By: Nelson Chimes M.D.   On: 06/07/2018 11:58   Nm Myocar Multi W/spect W/wall Motion / Ef  Result Date: 06/07/2018  The study is normal.  This is a low risk study.  The left ventricular ejection fraction is hyperdynamic (>65%).  Blood pressure demonstrated a normal response to exercise.      Management plans discussed with the patient, family and they are in agreement.  CODE STATUS:     Code Status Orders  (From admission, onward)         Start     Ordered   06/06/18  0141  Full code  Continuous     06/06/18 0140        Code Status History    Date Active Date Inactive Code Status Order ID Comments User Context   08/21/2016 0943 08/21/2016 1437 Full Code [id number]  Isaias Cowman, MD Inpatient    Advance Directive Documentation     Most Recent Value  Type of Advance Directive  Healthcare Power of Attorney  Pre-existing out of facility DNR order (yellow form or pink MOST form)  -  "MOST" Form in Place?  -      TOTAL TIME TAKING CARE OF THIS PATIENT: 38 minutes.    Gladstone Lighter M.D on 06/07/2018 at 2:59 PM  Between 7am to 6pm -  Pager - [phone]  After 6pm go to [email or web address] - Proofreader  [facility]  Office  [phone]  CC: Primary care physician; Tracie Harrier, MD   Note: This dictation was prepared with Dragon dictation along with smaller phrase technology. Any transcriptional errors that result from this process are unintentional.

## 2018-06-07 NOTE — Discharge Instructions (Signed)

## 2018-06-07 NOTE — Plan of Care (Signed)
  Problem: Clinical Measurements: Goal: Respiratory complications will improve Outcome: Progressing   Problem: Activity: Goal: Risk for activity intolerance will decrease Outcome: Progressing Note:  Up in room independently, tolerating well   Problem: Nutrition: Goal: Adequate nutrition will be maintained Outcome: Progressing   Problem: Coping: Goal: Level of anxiety will decrease Outcome: Progressing   Problem: Pain Managment: Goal: General experience of comfort will improve Outcome: Progressing Note:  No complaints of pain this shift   Problem: Safety: Goal: Ability to remain free from injury will improve Outcome: Progressing   Problem: Skin Integrity: Goal: Risk for impaired skin integrity will decrease Outcome: Progressing   Problem: Education: Goal: Knowledge of General Education information will improve Description Including pain rating scale, medication(s)/side effects and non-pharmacologic comfort measures Outcome: Completed/Met

## 2018-06-07 NOTE — Progress Notes (Signed)
[facility] [place] NAME: Lisa Cowan    MR#:  [id number]  DATE OF BIRTH:  March 13, 1941  SUBJECTIVE:  CHIEF COMPLAINT:   Chief Complaint  Patient presents with  . Chest Pain  . Near Syncope   -Admitted with chest pain dyspnea near syncope -Symptoms have resolved.  Going for stress test today  REVIEW OF SYSTEMS:  Review of Systems  Constitutional: Negative for chills and fever.  HENT: Negative for congestion, ear discharge, hearing loss and nosebleeds.   Eyes: Negative for blurred vision and double vision.  Respiratory: Positive for shortness of breath. Negative for cough and wheezing.   Cardiovascular: Positive for chest pain. Negative for palpitations.  Gastrointestinal: Negative for abdominal pain, constipation, diarrhea, nausea and vomiting.  Genitourinary: Negative for dysuria.  Musculoskeletal: Negative for myalgias.  Neurological: Negative for dizziness, focal weakness, seizures, weakness and headaches.  Psychiatric/Behavioral: Negative for depression.    DRUG ALLERGIES:   Allergies  Allergen Reactions  . Codeine Itching and Other (See Comments)    hallucinations  . Hydrocodone-Acetaminophen Itching and Other (See Comments)    hallucinations  . Nucynta [Tapentadol] Itching and Other (See Comments)    hallucinations  . Oxycodone-Acetaminophen Itching and Other (See Comments)    hallucinations  . Statins     VITALS:  Blood pressure 125/70, pulse 67, temperature 97.8 F (36.6 C), temperature source Oral, resp. rate 18, height 4\' 11"  (1.499 m), weight 81.3 kg, SpO2 94 %.  PHYSICAL EXAMINATION:  Physical Exam   GENERAL:  77 y.o.-year-old patient lying in the bed with no acute distress.  EYES: Pupils equal, round, reactive to light and accommodation. No scleral icterus. Extraocular muscles intact.  HEENT: Head atraumatic, normocephalic. Oropharynx and nasopharynx clear.  NECK:  Supple, no jugular venous distention.  No thyroid enlargement, no tenderness.  LUNGS: Normal breath sounds bilaterally, no wheezing, rales,rhonchi or crepitation. No use of accessory muscles of respiration.  Decreased bibasilar breath sounds CARDIOVASCULAR: S1, S2 normal. No  rubs, or gallops.  2/6 systolic murmur is present ABDOMEN: Soft, nontender, nondistended. Bowel sounds present. No organomegaly or mass.  EXTREMITIES: No pedal edema, cyanosis, or clubbing.  NEUROLOGIC: Cranial nerves II through XII are intact. Muscle strength 5/5 in all extremities. Sensation intact. Gait not checked.  PSYCHIATRIC: The patient is alert and oriented x 3.  SKIN: No obvious rash, lesion, or ulcer.    LABORATORY PANEL:   CBC Recent Labs  Lab 06/06/18 0158  WBC 7.0  HGB 13.1  HCT 38.9  PLT 239   ------------------------------------------------------------------------------------------------------------------  Chemistries  Recent Labs  Lab 06/07/18 0810  NA 135  K 4.3  CL 95*  CO2 30  GLUCOSE 114*  BUN 12  CREATININE 0.81  CALCIUM 9.0   ------------------------------------------------------------------------------------------------------------------  Cardiac Enzymes Recent Labs  Lab 06/06/18 0158  TROPONINI <0.03   ------------------------------------------------------------------------------------------------------------------  RADIOLOGY:  Dg Chest 2 View  Result Date: 06/05/2018 CLINICAL DATA:  Chest pain, shortness of Breath EXAM: CHEST - 2 VIEW COMPARISON:  02/29/2009 FINDINGS: Heart is normal size. No confluent airspace opacities, effusions or edema. No acute bony abnormality. Old left rib fractures are stable. IMPRESSION: No active cardiopulmonary disease. Electronically Signed   By: Rolm Baptise M.D.   On: 06/05/2018 17:14    EKG:   Orders placed or performed during the hospital encounter of 06/05/18  . ED EKG within 10 minutes  . ED EKG within 10 minutes    ASSESSMENT AND PLAN:   77 year old female  with past medical history significant for GERD, history of cervical cancer, hypertension, hyperlipidemia presents to hospital secondary to chest pain or shortness of breath  1.  Chest pain-after further discussion, seems more like GI symptoms -However due to episode of dyspnea and chest pain prior to presentation, and elevated d-dimer-get a CT angios to rule out pulmonary embolism -Troponins are -2.  For stress test today. -Cardiac cath with minimal coronary artery disease and stress test negative last year. -Appreciate cardiology consult. -Patient already had a EGD done January 2019 that showed normal stomach  2. HTN- atenolol  3. GERD- protonix- increase to twice a day at discharge  4. Neuropathy- neurontin  5. DVT Prophylaxis- lovenox  Independent and ambulatory at baseline   All the records are reviewed and case discussed with Care Management/Social Workerr. Management plans discussed with the patient, family and they are in agreement.  CODE STATUS: Full Code  TOTAL TIME TAKING CARE OF THIS PATIENT: 38 minutes.   POSSIBLE D/C IN to 1-2 DAYS, DEPENDING ON CLINICAL CONDITION.   Gladstone Lighter M.D on 06/07/2018 at 9:20 AM  Between 7am to 6pm - Pager - [phone]  After 6pm go to [email or web address] - password [place] [facility]  Office  [phone]  CC: Primary care physician; Tracie Harrier, MD

## 2018-06-14 DIAGNOSIS — S20111A Abrasion of breast, right breast, initial encounter: Secondary | ICD-10-CM | POA: Diagnosis not present

## 2018-06-14 DIAGNOSIS — S20112A Abrasion of breast, left breast, initial encounter: Secondary | ICD-10-CM | POA: Diagnosis not present

## 2018-06-14 DIAGNOSIS — L089 Local infection of the skin and subcutaneous tissue, unspecified: Secondary | ICD-10-CM | POA: Diagnosis not present

## 2018-06-14 DIAGNOSIS — T148XXA Other injury of unspecified body region, initial encounter: Secondary | ICD-10-CM | POA: Diagnosis not present

## 2018-07-01 DIAGNOSIS — F329 Major depressive disorder, single episode, unspecified: Secondary | ICD-10-CM | POA: Diagnosis not present

## 2018-07-01 DIAGNOSIS — E279 Disorder of adrenal gland, unspecified: Secondary | ICD-10-CM | POA: Diagnosis not present

## 2018-07-01 DIAGNOSIS — G4733 Obstructive sleep apnea (adult) (pediatric): Secondary | ICD-10-CM | POA: Diagnosis not present

## 2018-07-01 DIAGNOSIS — K219 Gastro-esophageal reflux disease without esophagitis: Secondary | ICD-10-CM | POA: Diagnosis not present

## 2018-07-01 DIAGNOSIS — I10 Essential (primary) hypertension: Secondary | ICD-10-CM | POA: Diagnosis not present

## 2018-07-08 DIAGNOSIS — G4733 Obstructive sleep apnea (adult) (pediatric): Secondary | ICD-10-CM | POA: Diagnosis not present

## 2018-07-08 DIAGNOSIS — L03313 Cellulitis of chest wall: Secondary | ICD-10-CM | POA: Diagnosis not present

## 2018-07-08 DIAGNOSIS — K219 Gastro-esophageal reflux disease without esophagitis: Secondary | ICD-10-CM | POA: Diagnosis not present

## 2018-07-08 DIAGNOSIS — M5137 Other intervertebral disc degeneration, lumbosacral region: Secondary | ICD-10-CM | POA: Diagnosis not present

## 2018-07-08 DIAGNOSIS — G8929 Other chronic pain: Secondary | ICD-10-CM | POA: Diagnosis not present

## 2018-07-08 DIAGNOSIS — Z0000 Encounter for general adult medical examination without abnormal findings: Secondary | ICD-10-CM | POA: Diagnosis not present

## 2018-07-08 DIAGNOSIS — M5442 Lumbago with sciatica, left side: Secondary | ICD-10-CM | POA: Diagnosis not present

## 2018-07-08 DIAGNOSIS — Z1239 Encounter for other screening for malignant neoplasm of breast: Secondary | ICD-10-CM | POA: Diagnosis not present

## 2018-07-08 DIAGNOSIS — F3340 Major depressive disorder, recurrent, in remission, unspecified: Secondary | ICD-10-CM | POA: Diagnosis not present

## 2018-07-08 DIAGNOSIS — M542 Cervicalgia: Secondary | ICD-10-CM | POA: Diagnosis not present

## 2018-07-08 DIAGNOSIS — M5441 Lumbago with sciatica, right side: Secondary | ICD-10-CM | POA: Diagnosis not present

## 2018-07-08 DIAGNOSIS — I10 Essential (primary) hypertension: Secondary | ICD-10-CM | POA: Diagnosis not present

## 2018-08-19 ENCOUNTER — Other Ambulatory Visit (HOSPITAL_COMMUNITY): Payer: Self-pay | Admitting: Specialist

## 2018-08-19 ENCOUNTER — Other Ambulatory Visit: Payer: Self-pay | Admitting: Specialist

## 2018-08-19 DIAGNOSIS — J439 Emphysema, unspecified: Secondary | ICD-10-CM | POA: Diagnosis not present

## 2018-08-19 DIAGNOSIS — G4733 Obstructive sleep apnea (adult) (pediatric): Secondary | ICD-10-CM | POA: Diagnosis not present

## 2018-08-19 DIAGNOSIS — R911 Solitary pulmonary nodule: Secondary | ICD-10-CM

## 2018-09-16 DIAGNOSIS — R0602 Shortness of breath: Secondary | ICD-10-CM | POA: Diagnosis not present

## 2018-09-16 DIAGNOSIS — I10 Essential (primary) hypertension: Secondary | ICD-10-CM | POA: Diagnosis not present

## 2018-09-16 DIAGNOSIS — J439 Emphysema, unspecified: Secondary | ICD-10-CM | POA: Diagnosis not present

## 2018-09-16 DIAGNOSIS — R079 Chest pain, unspecified: Secondary | ICD-10-CM | POA: Diagnosis not present

## 2018-09-16 DIAGNOSIS — G4733 Obstructive sleep apnea (adult) (pediatric): Secondary | ICD-10-CM | POA: Diagnosis not present

## 2018-09-20 ENCOUNTER — Other Ambulatory Visit: Payer: Self-pay

## 2018-09-20 ENCOUNTER — Ambulatory Visit
Admission: RE | Admit: 2018-09-20 | Discharge: 2018-09-20 | Disposition: A | Discharge status: Home / Self Care | Payer: PPO | Source: Ambulatory Visit | Attending: Specialist | Admitting: Specialist

## 2018-09-20 DIAGNOSIS — J432 Centrilobular emphysema: Secondary | ICD-10-CM | POA: Diagnosis not present

## 2018-09-20 DIAGNOSIS — R911 Solitary pulmonary nodule: Secondary | ICD-10-CM | POA: Insufficient documentation

## 2018-10-07 ENCOUNTER — Other Ambulatory Visit: Payer: Self-pay | Admitting: Specialist

## 2018-10-07 DIAGNOSIS — R911 Solitary pulmonary nodule: Secondary | ICD-10-CM

## 2018-11-10 DIAGNOSIS — L98499 Non-pressure chronic ulcer of skin of other sites with unspecified severity: Secondary | ICD-10-CM | POA: Diagnosis not present

## 2018-11-10 DIAGNOSIS — L309 Dermatitis, unspecified: Secondary | ICD-10-CM | POA: Diagnosis not present

## 2018-11-13 DIAGNOSIS — G4733 Obstructive sleep apnea (adult) (pediatric): Secondary | ICD-10-CM | POA: Diagnosis not present

## 2018-11-22 DIAGNOSIS — L98491 Non-pressure chronic ulcer of skin of other sites limited to breakdown of skin: Secondary | ICD-10-CM | POA: Diagnosis not present

## 2018-12-28 DIAGNOSIS — G4733 Obstructive sleep apnea (adult) (pediatric): Secondary | ICD-10-CM | POA: Diagnosis not present

## 2018-12-28 DIAGNOSIS — J300 Vasomotor rhinitis: Secondary | ICD-10-CM | POA: Diagnosis not present

## 2019-01-07 DIAGNOSIS — M542 Cervicalgia: Secondary | ICD-10-CM | POA: Diagnosis not present

## 2019-01-07 DIAGNOSIS — G8929 Other chronic pain: Secondary | ICD-10-CM | POA: Diagnosis not present

## 2019-01-07 DIAGNOSIS — M5137 Other intervertebral disc degeneration, lumbosacral region: Secondary | ICD-10-CM | POA: Diagnosis not present

## 2019-01-07 DIAGNOSIS — G4733 Obstructive sleep apnea (adult) (pediatric): Secondary | ICD-10-CM | POA: Diagnosis not present

## 2019-01-07 DIAGNOSIS — Z0000 Encounter for general adult medical examination without abnormal findings: Secondary | ICD-10-CM | POA: Diagnosis not present

## 2019-01-07 DIAGNOSIS — K219 Gastro-esophageal reflux disease without esophagitis: Secondary | ICD-10-CM | POA: Diagnosis not present

## 2019-01-07 DIAGNOSIS — I10 Essential (primary) hypertension: Secondary | ICD-10-CM | POA: Diagnosis not present

## 2019-01-07 DIAGNOSIS — F3340 Major depressive disorder, recurrent, in remission, unspecified: Secondary | ICD-10-CM | POA: Diagnosis not present

## 2019-01-07 DIAGNOSIS — L03313 Cellulitis of chest wall: Secondary | ICD-10-CM | POA: Diagnosis not present

## 2019-01-07 DIAGNOSIS — M5442 Lumbago with sciatica, left side: Secondary | ICD-10-CM | POA: Diagnosis not present

## 2019-01-07 DIAGNOSIS — M5441 Lumbago with sciatica, right side: Secondary | ICD-10-CM | POA: Diagnosis not present

## 2019-01-13 DIAGNOSIS — J439 Emphysema, unspecified: Secondary | ICD-10-CM | POA: Diagnosis not present

## 2019-01-13 DIAGNOSIS — G4733 Obstructive sleep apnea (adult) (pediatric): Secondary | ICD-10-CM | POA: Diagnosis not present

## 2019-01-13 DIAGNOSIS — R911 Solitary pulmonary nodule: Secondary | ICD-10-CM | POA: Diagnosis not present

## 2019-01-14 DIAGNOSIS — Z6837 Body mass index (BMI) 37.0-37.9, adult: Secondary | ICD-10-CM | POA: Diagnosis not present

## 2019-01-14 DIAGNOSIS — J439 Emphysema, unspecified: Secondary | ICD-10-CM | POA: Diagnosis not present

## 2019-01-14 DIAGNOSIS — Z0000 Encounter for general adult medical examination without abnormal findings: Secondary | ICD-10-CM | POA: Diagnosis not present

## 2019-01-14 DIAGNOSIS — G4733 Obstructive sleep apnea (adult) (pediatric): Secondary | ICD-10-CM | POA: Diagnosis not present

## 2019-01-14 DIAGNOSIS — F3340 Major depressive disorder, recurrent, in remission, unspecified: Secondary | ICD-10-CM | POA: Diagnosis not present

## 2019-01-14 DIAGNOSIS — M5137 Other intervertebral disc degeneration, lumbosacral region: Secondary | ICD-10-CM | POA: Diagnosis not present

## 2019-01-14 DIAGNOSIS — I10 Essential (primary) hypertension: Secondary | ICD-10-CM | POA: Diagnosis not present

## 2019-02-17 DIAGNOSIS — G4733 Obstructive sleep apnea (adult) (pediatric): Secondary | ICD-10-CM | POA: Diagnosis not present

## 2019-02-23 DIAGNOSIS — L298 Other pruritus: Secondary | ICD-10-CM | POA: Diagnosis not present

## 2019-02-23 DIAGNOSIS — D225 Melanocytic nevi of trunk: Secondary | ICD-10-CM | POA: Diagnosis not present

## 2019-02-23 DIAGNOSIS — D2271 Melanocytic nevi of right lower limb, including hip: Secondary | ICD-10-CM | POA: Diagnosis not present

## 2019-02-23 DIAGNOSIS — D2261 Melanocytic nevi of right upper limb, including shoulder: Secondary | ICD-10-CM | POA: Diagnosis not present

## 2019-02-23 DIAGNOSIS — Z85828 Personal history of other malignant neoplasm of skin: Secondary | ICD-10-CM | POA: Diagnosis not present

## 2019-02-23 DIAGNOSIS — D2262 Melanocytic nevi of left upper limb, including shoulder: Secondary | ICD-10-CM | POA: Diagnosis not present

## 2019-02-23 DIAGNOSIS — L570 Actinic keratosis: Secondary | ICD-10-CM | POA: Diagnosis not present

## 2019-02-23 DIAGNOSIS — Z08 Encounter for follow-up examination after completed treatment for malignant neoplasm: Secondary | ICD-10-CM | POA: Diagnosis not present

## 2019-02-23 DIAGNOSIS — X32XXXA Exposure to sunlight, initial encounter: Secondary | ICD-10-CM | POA: Diagnosis not present

## 2019-02-23 DIAGNOSIS — L910 Hypertrophic scar: Secondary | ICD-10-CM | POA: Diagnosis not present

## 2019-02-23 DIAGNOSIS — D2272 Melanocytic nevi of left lower limb, including hip: Secondary | ICD-10-CM | POA: Diagnosis not present

## 2019-03-17 DIAGNOSIS — R0602 Shortness of breath: Secondary | ICD-10-CM | POA: Diagnosis not present

## 2019-03-17 DIAGNOSIS — I10 Essential (primary) hypertension: Secondary | ICD-10-CM | POA: Diagnosis not present

## 2019-03-17 DIAGNOSIS — J439 Emphysema, unspecified: Secondary | ICD-10-CM | POA: Diagnosis not present

## 2019-03-17 DIAGNOSIS — G4733 Obstructive sleep apnea (adult) (pediatric): Secondary | ICD-10-CM | POA: Diagnosis not present

## 2019-04-11 DIAGNOSIS — H2513 Age-related nuclear cataract, bilateral: Secondary | ICD-10-CM | POA: Diagnosis not present

## 2019-04-19 DIAGNOSIS — I10 Essential (primary) hypertension: Secondary | ICD-10-CM | POA: Diagnosis not present

## 2019-04-19 DIAGNOSIS — H2511 Age-related nuclear cataract, right eye: Secondary | ICD-10-CM | POA: Diagnosis not present

## 2019-04-21 ENCOUNTER — Encounter: Payer: Self-pay | Admitting: *Deleted

## 2019-04-21 ENCOUNTER — Other Ambulatory Visit: Payer: Self-pay

## 2019-04-22 ENCOUNTER — Other Ambulatory Visit
Admission: RE | Admit: 2019-04-22 | Discharge: 2019-04-22 | Disposition: A | Discharge status: Home / Self Care | Payer: PPO | Source: Ambulatory Visit | Attending: Ophthalmology | Admitting: Ophthalmology

## 2019-04-22 DIAGNOSIS — Z01812 Encounter for preprocedural laboratory examination: Secondary | ICD-10-CM | POA: Diagnosis not present

## 2019-04-22 DIAGNOSIS — Z20828 Contact with and (suspected) exposure to other viral communicable diseases: Secondary | ICD-10-CM | POA: Diagnosis not present

## 2019-04-23 LAB — SARS CORONAVIRUS 2 (TAT 6-24 HRS): SARS Coronavirus 2: NEGATIVE

## 2019-04-25 NOTE — Discharge Instructions (Signed)

## 2019-05-10 ENCOUNTER — Encounter: Payer: Self-pay | Admitting: *Deleted

## 2019-05-10 ENCOUNTER — Other Ambulatory Visit: Payer: Self-pay

## 2019-05-13 ENCOUNTER — Other Ambulatory Visit
Admission: RE | Admit: 2019-05-13 | Discharge: 2019-05-13 | Disposition: A | Discharge status: Home / Self Care | Payer: PPO | Source: Ambulatory Visit | Attending: Ophthalmology | Admitting: Ophthalmology

## 2019-05-13 DIAGNOSIS — Z20828 Contact with and (suspected) exposure to other viral communicable diseases: Secondary | ICD-10-CM | POA: Insufficient documentation

## 2019-05-13 DIAGNOSIS — Z01812 Encounter for preprocedural laboratory examination: Secondary | ICD-10-CM | POA: Diagnosis not present

## 2019-05-13 LAB — SARS CORONAVIRUS 2 (TAT 6-24 HRS): SARS Coronavirus 2: NEGATIVE

## 2019-05-18 ENCOUNTER — Ambulatory Visit: Payer: PPO | Admitting: Anesthesiology

## 2019-05-18 ENCOUNTER — Ambulatory Visit
Admission: RE | Admit: 2019-05-18 | Discharge: 2019-05-18 | Disposition: A | Discharge status: Home / Self Care | Payer: PPO | Attending: Ophthalmology | Admitting: Ophthalmology

## 2019-05-18 ENCOUNTER — Encounter
Admission: RE | Disposition: A | Discharge status: Home / Self Care | Payer: Self-pay | Source: Home / Self Care | Attending: Ophthalmology

## 2019-05-18 DIAGNOSIS — J449 Chronic obstructive pulmonary disease, unspecified: Secondary | ICD-10-CM | POA: Insufficient documentation

## 2019-05-18 DIAGNOSIS — E7800 Pure hypercholesterolemia, unspecified: Secondary | ICD-10-CM | POA: Diagnosis not present

## 2019-05-18 DIAGNOSIS — Z7951 Long term (current) use of inhaled steroids: Secondary | ICD-10-CM | POA: Diagnosis not present

## 2019-05-18 DIAGNOSIS — G4730 Sleep apnea, unspecified: Secondary | ICD-10-CM | POA: Insufficient documentation

## 2019-05-18 DIAGNOSIS — F329 Major depressive disorder, single episode, unspecified: Secondary | ICD-10-CM | POA: Insufficient documentation

## 2019-05-18 DIAGNOSIS — Z885 Allergy status to narcotic agent status: Secondary | ICD-10-CM | POA: Insufficient documentation

## 2019-05-18 DIAGNOSIS — Z791 Long term (current) use of non-steroidal anti-inflammatories (NSAID): Secondary | ICD-10-CM | POA: Insufficient documentation

## 2019-05-18 DIAGNOSIS — Z888 Allergy status to other drugs, medicaments and biological substances status: Secondary | ICD-10-CM | POA: Insufficient documentation

## 2019-05-18 DIAGNOSIS — M1990 Unspecified osteoarthritis, unspecified site: Secondary | ICD-10-CM | POA: Insufficient documentation

## 2019-05-18 DIAGNOSIS — I10 Essential (primary) hypertension: Secondary | ICD-10-CM | POA: Diagnosis not present

## 2019-05-18 DIAGNOSIS — H2511 Age-related nuclear cataract, right eye: Secondary | ICD-10-CM | POA: Diagnosis not present

## 2019-05-18 DIAGNOSIS — H25811 Combined forms of age-related cataract, right eye: Secondary | ICD-10-CM | POA: Diagnosis not present

## 2019-05-18 DIAGNOSIS — Z79899 Other long term (current) drug therapy: Secondary | ICD-10-CM | POA: Diagnosis not present

## 2019-05-18 DIAGNOSIS — K219 Gastro-esophageal reflux disease without esophagitis: Secondary | ICD-10-CM | POA: Diagnosis not present

## 2019-05-18 HISTORY — PX: CATARACT EXTRACTION W/PHACO: SHX586

## 2019-05-18 SURGERY — PHACOEMULSIFICATION, CATARACT, WITH IOL INSERTION
Anesthesia: Monitor Anesthesia Care | Site: Eye | Laterality: Right

## 2019-05-18 MED ORDER — ONDANSETRON HCL 4 MG/2ML IJ SOLN: 4.0000 mg | Freq: Once | INTRAMUSCULAR | Status: DC | PRN

## 2019-05-18 MED ORDER — MOXIFLOXACIN HCL 0.5 % OP SOLN
1.0000 [drp] | OPHTHALMIC | Status: DC | PRN
  Administered 2019-05-18 (×3): 1 [drp] via OPHTHALMIC

## 2019-05-18 MED ORDER — NA HYALUR & NA CHOND-NA HYALUR 0.4-0.35 ML IO KIT
PACK | INTRAOCULAR | Status: DC | PRN
  Administered 2019-05-18: 1 mL via INTRAOCULAR

## 2019-05-18 MED ORDER — BRIMONIDINE TARTRATE-TIMOLOL 0.2-0.5 % OP SOLN
OPHTHALMIC | Status: DC | PRN
  Administered 2019-05-18: 1 [drp] via OPHTHALMIC

## 2019-05-18 MED ORDER — FENTANYL CITRATE (PF) 100 MCG/2ML IJ SOLN
INTRAMUSCULAR | Status: DC | PRN
  Administered 2019-05-18: 50 ug via INTRAVENOUS

## 2019-05-18 MED ORDER — ARMC OPHTHALMIC DILATING DROPS
1.0000 "application " | OPHTHALMIC | Status: DC | PRN
  Administered 2019-05-18 (×3): 1 via OPHTHALMIC

## 2019-05-18 MED ORDER — CEFUROXIME OPHTHALMIC INJECTION 1 MG/0.1 ML
INJECTION | OPHTHALMIC | Status: DC | PRN
  Administered 2019-05-18: 0.1 mL via INTRACAMERAL

## 2019-05-18 MED ORDER — LACTATED RINGERS IV SOLN: INTRAVENOUS | Status: DC

## 2019-05-18 MED ORDER — LIDOCAINE HCL (PF) 2 % IJ SOLN
INTRAOCULAR | Status: DC | PRN
  Administered 2019-05-18: 10:00:00

## 2019-05-18 MED ORDER — EPINEPHRINE PF 1 MG/ML IJ SOLN
INTRAOCULAR | Status: DC | PRN
  Administered 2019-05-18: 62 mL via OPHTHALMIC

## 2019-05-18 MED ORDER — MIDAZOLAM HCL 2 MG/2ML IJ SOLN
INTRAMUSCULAR | Status: DC | PRN
  Administered 2019-05-18: 1.5 mg via INTRAVENOUS

## 2019-05-18 MED ORDER — TETRACAINE HCL 0.5 % OP SOLN
1.0000 [drp] | OPHTHALMIC | Status: DC | PRN
  Administered 2019-05-18 (×3): 1 [drp] via OPHTHALMIC

## 2019-05-18 SURGICAL SUPPLY — 16 items
CANNULA ANT/CHMB 27G (MISCELLANEOUS) ×1 IMPLANT
CANNULA ANT/CHMB 27GA (MISCELLANEOUS) ×2 IMPLANT
GLOVE SURG LX 7.5 STRW (GLOVE) ×1
GLOVE SURG LX STRL 7.5 STRW (GLOVE) ×1 IMPLANT
GLOVE SURG TRIUMPH 8.0 PF LTX (GLOVE) ×2 IMPLANT
GOWN STRL REUS W/ TWL LRG LVL3 (GOWN DISPOSABLE) ×2 IMPLANT
GOWN STRL REUS W/TWL LRG LVL3 (GOWN DISPOSABLE) ×2
LENS IOL TECNIS ITEC 24.5 (Intraocular Lens) ×1 IMPLANT
MARKER SKIN DUAL TIP RULER LAB (MISCELLANEOUS) ×2 IMPLANT
PACK CATARACT BRASINGTON (MISCELLANEOUS) ×2 IMPLANT
PACK EYE AFTER SURG (MISCELLANEOUS) ×2 IMPLANT
PACK OPTHALMIC (MISCELLANEOUS) ×2 IMPLANT
SYR 3ML LL SCALE MARK (SYRINGE) ×2 IMPLANT
SYR TB 1ML LUER SLIP (SYRINGE) ×2 IMPLANT
WATER STERILE IRR 500ML POUR (IV SOLUTION) ×2 IMPLANT
WIPE NON LINTING 3.25X3.25 (MISCELLANEOUS) ×2 IMPLANT

## 2019-05-18 NOTE — H&P (Signed)

## 2019-05-18 NOTE — Transfer of Care (Signed)
Immediate Anesthesia Transfer of Care Note  Patient: Lisa Cowan  Procedure(s) Performed: CATARACT EXTRACTION PHACO AND INTRAOCULAR LENS PLACEMENT (IOC) Right  00:52.2  18.7%  9.59 (Right Eye)  Patient Location: PACU  Anesthesia Type: MAC  Level of Consciousness: awake, alert  and patient cooperative  Airway and Oxygen Therapy: Patient Spontanous Breathing and Patient connected to supplemental oxygen  Post-op Assessment: Post-op Vital signs reviewed, Patient's Cardiovascular Status Stable, Respiratory Function Stable, Patent Airway and No signs of Nausea or vomiting  Post-op Vital Signs: Reviewed and stable  Complications: No apparent anesthesia complications

## 2019-05-18 NOTE — Op Note (Signed)
LOCATION:  [facility]   PREOPERATIVE DIAGNOSIS:    Nuclear sclerotic cataract right eye. H25.11   POSTOPERATIVE DIAGNOSIS:  Nuclear sclerotic cataract right eye.     PROCEDURE:  Phacoemusification with posterior chamber intraocular lens placement of the right eye   ULTRASOUND TIME: Procedure(s) with comments: CATARACT EXTRACTION PHACO AND INTRAOCULAR LENS PLACEMENT (IOC) Right  00:52.2  18.7%  9.59 (Right) - sleep apnea  LENS:   Implant Name Type Inv. Item Serial No. Manufacturer Lot No. LRB No. Used Action  LENS IOL DIOP 24.5 - [id number] Intraocular Lens LENS IOL DIOP 24.5 [id number] [id number]  Right 1 Implanted         SURGEON:  Wyonia Hough, MD   ANESTHESIA:  Topical with tetracaine drops and 2% Xylocaine jelly, augmented with 1% preservative-free intracameral lidocaine.    COMPLICATIONS:  None.   DESCRIPTION OF PROCEDURE:  The patient was identified in the holding room and transported to the operating room and placed in the supine position under the operating microscope.  The right eye was identified as the operative eye and it was prepped and draped in the usual sterile ophthalmic fashion.   A 1 millimeter clear-corneal paracentesis was made at the 12:00 position.  0.5 ml of preservative-free 1% lidocaine was injected into the anterior chamber. The anterior chamber was filled with Viscoat viscoelastic.  A 2.4 millimeter keratome was used to make a near-clear corneal incision at the 9:00 position.  A curvilinear capsulorrhexis was made with a cystotome and capsulorrhexis forceps.  Balanced salt solution was used to hydrodissect and hydrodelineate the nucleus.   Phacoemulsification was then used in stop and chop fashion to remove the lens nucleus and epinucleus.  The remaining cortex was then removed using the irrigation and aspiration handpiece. Provisc was then placed into the capsular bag to distend it for lens placement.  A lens was then injected into the capsular  bag.  The remaining viscoelastic was aspirated.   Wounds were hydrated with balanced salt solution.  The anterior chamber was inflated to a physiologic pressure with balanced salt solution.  No wound leaks were noted. Cefuroxime 0.1 ml of a 10mg /ml solution was injected into the anterior chamber for a dose of 1 mg of intracameral antibiotic at the completion of the case.   Timolol and Brimonidine drops were applied to the eye.  The patient was taken to the recovery room in stable condition without complications of anesthesia or surgery.   Viet Kemmerer 05/18/2019, 9:55 AM

## 2019-05-18 NOTE — Anesthesia Procedure Notes (Signed)
Procedure Name: MAC Performed by: Javionna Leder, CRNA Pre-anesthesia Checklist: Patient identified, Emergency Drugs available, Suction available, Timeout performed and Patient being monitored Patient Re-evaluated:Patient Re-evaluated prior to induction Oxygen Delivery Method: Nasal cannula Placement Confirmation: positive ETCO2       

## 2019-05-18 NOTE — Anesthesia Postprocedure Evaluation (Signed)
Anesthesia Post Note  Patient: Lisa Cowan  Procedure(s) Performed: CATARACT EXTRACTION PHACO AND INTRAOCULAR LENS PLACEMENT (IOC) Right  00:52.2  18.7%  9.59 (Right Eye)  Patient location during evaluation: PACU Anesthesia Type: MAC Level of consciousness: awake and alert Pain management: pain level controlled Vital Signs Assessment: post-procedure vital signs reviewed and stable Respiratory status: spontaneous breathing, nonlabored ventilation, respiratory function stable and patient connected to nasal cannula oxygen Cardiovascular status: stable and blood pressure returned to baseline Postop Assessment: no apparent nausea or vomiting Anesthetic complications: no    Sinda Du

## 2019-05-18 NOTE — Anesthesia Preprocedure Evaluation (Signed)
Anesthesia Evaluation  Patient identified by MRN, date of birth, ID band Patient awake    Reviewed: Allergy & Precautions, NPO status , reviewed documented beta blocker date and time   History of Anesthesia Complications Negative for: history of anesthetic complications  Airway Mallampati: II  TM Distance: >3 FB Neck ROM: Full    Dental no notable dental hx.    Pulmonary sleep apnea , COPD, former smoker,    Pulmonary exam normal breath sounds clear to auscultation       Cardiovascular Exercise Tolerance: Good hypertension, + angina Normal cardiovascular exam Rhythm:Regular Rate:Normal     Neuro/Psych  Headaches, PSYCHIATRIC DISORDERS Depression    GI/Hepatic Neg liver ROS, GERD  ,  Endo/Other  negative endocrine ROS  Renal/GU negative Renal ROS  negative genitourinary   Musculoskeletal  (+) Arthritis ,   Abdominal (+) + obese,   Peds negative pediatric ROS (+)  Hematology  (+) anemia ,   Anesthesia Other Findings   Reproductive/Obstetrics negative OB ROS                             Anesthesia Physical Anesthesia Plan  ASA: III  Anesthesia Plan: MAC   Post-op Pain Management:    Induction: Intravenous  PONV Risk Score and Plan: 2 and Treatment may vary due to age or medical condition  Airway Management Planned: Natural Airway  Additional Equipment:   Intra-op Plan:   Post-operative Plan:   Informed Consent: I have reviewed the patients History and Physical, chart, labs and discussed the procedure including the risks, benefits and alternatives for the proposed anesthesia with the patient or authorized representative who has indicated his/her understanding and acceptance.     Dental advisory given  Plan Discussed with: CRNA and Anesthesiologist  Anesthesia Plan Comments:         Anesthesia Quick Evaluation  Patient Active Problem List   Diagnosis Date Noted   . Chest pain 06/05/2018  . HTN (hypertension) 06/05/2018  . HLD (hyperlipidemia) 06/05/2018  . OSA (obstructive sleep apnea) 06/05/2018  . GERD (gastroesophageal reflux disease) 06/05/2018  . COPD (chronic obstructive pulmonary disease) ([place]) 06/05/2018    CBC Latest Ref Rng & Units 06/06/2018 06/05/2018  WBC 4.0 - 10.5 K/uL 7.0 9.6  Hemoglobin 12.0 - 15.0 g/dL 13.1 13.8  Hematocrit 36.0 - 46.0 % 38.9 41.6  Platelets 150 - 400 K/uL 239 256   BMP Latest Ref Rng & Units 06/07/2018 06/06/2018 06/05/2018  Glucose 70 - 99 mg/dL 114(H) 110(H) 112(H)  BUN 8 - 23 mg/dL _0 Creatinine 0.44 - 1.00 mg/dL 0.81 0.78 0.83  Sodium 135 - 145 mmol/L 135 132(L) 132(L)  Potassium 3.5 - 5.1 mmol/L 4.3 3.0(L) 3.1(L)  Chloride 98 - 111 mmol/L 95(L) 92(L) 89(L)  CO2 22 - 32 mmol/L 30 31 32  Calcium 8.9 - 10.3 mg/dL 9.0 8.7(L) 9.1   Risks and benefits of anesthesia discussed at length, patient or surrogate demonstrates understanding. Appropriately NPO. Plan to proceed with anesthesia.  Champ Mungo, MD 05/18/19

## 2019-05-19 ENCOUNTER — Encounter: Payer: Self-pay | Admitting: Ophthalmology

## 2019-05-30 DIAGNOSIS — I10 Essential (primary) hypertension: Secondary | ICD-10-CM | POA: Diagnosis not present

## 2019-05-30 DIAGNOSIS — H2512 Age-related nuclear cataract, left eye: Secondary | ICD-10-CM | POA: Diagnosis not present

## 2019-05-31 ENCOUNTER — Other Ambulatory Visit: Payer: Self-pay

## 2019-05-31 ENCOUNTER — Encounter: Payer: Self-pay | Admitting: *Deleted

## 2019-06-03 ENCOUNTER — Other Ambulatory Visit: Payer: Self-pay

## 2019-06-03 ENCOUNTER — Other Ambulatory Visit
Admission: RE | Admit: 2019-06-03 | Discharge: 2019-06-03 | Disposition: A | Discharge status: Home / Self Care | Payer: PPO | Source: Ambulatory Visit | Attending: Ophthalmology | Admitting: Ophthalmology

## 2019-06-03 DIAGNOSIS — Z20828 Contact with and (suspected) exposure to other viral communicable diseases: Secondary | ICD-10-CM | POA: Insufficient documentation

## 2019-06-03 DIAGNOSIS — Z01812 Encounter for preprocedural laboratory examination: Secondary | ICD-10-CM | POA: Insufficient documentation

## 2019-06-04 LAB — SARS CORONAVIRUS 2 (TAT 6-24 HRS): SARS Coronavirus 2: NEGATIVE

## 2019-06-06 NOTE — Discharge Instructions (Signed)

## 2019-06-08 ENCOUNTER — Encounter
Admission: RE | Disposition: A | Discharge status: Home / Self Care | Payer: Self-pay | Source: Home / Self Care | Attending: Ophthalmology

## 2019-06-08 ENCOUNTER — Ambulatory Visit: Payer: PPO | Admitting: Anesthesiology

## 2019-06-08 ENCOUNTER — Ambulatory Visit
Admission: RE | Admit: 2019-06-08 | Discharge: 2019-06-08 | Disposition: A | Discharge status: Home / Self Care | Payer: PPO | Attending: Ophthalmology | Admitting: Ophthalmology

## 2019-06-08 ENCOUNTER — Other Ambulatory Visit: Payer: Self-pay

## 2019-06-08 DIAGNOSIS — E7800 Pure hypercholesterolemia, unspecified: Secondary | ICD-10-CM | POA: Diagnosis not present

## 2019-06-08 DIAGNOSIS — K219 Gastro-esophageal reflux disease without esophagitis: Secondary | ICD-10-CM | POA: Insufficient documentation

## 2019-06-08 DIAGNOSIS — Z8541 Personal history of malignant neoplasm of cervix uteri: Secondary | ICD-10-CM | POA: Diagnosis not present

## 2019-06-08 DIAGNOSIS — Z885 Allergy status to narcotic agent status: Secondary | ICD-10-CM | POA: Diagnosis not present

## 2019-06-08 DIAGNOSIS — H25812 Combined forms of age-related cataract, left eye: Secondary | ICD-10-CM | POA: Diagnosis not present

## 2019-06-08 DIAGNOSIS — Z87891 Personal history of nicotine dependence: Secondary | ICD-10-CM | POA: Insufficient documentation

## 2019-06-08 DIAGNOSIS — Z888 Allergy status to other drugs, medicaments and biological substances status: Secondary | ICD-10-CM | POA: Diagnosis not present

## 2019-06-08 DIAGNOSIS — Z85828 Personal history of other malignant neoplasm of skin: Secondary | ICD-10-CM | POA: Diagnosis not present

## 2019-06-08 DIAGNOSIS — G4730 Sleep apnea, unspecified: Secondary | ICD-10-CM | POA: Insufficient documentation

## 2019-06-08 DIAGNOSIS — H2512 Age-related nuclear cataract, left eye: Secondary | ICD-10-CM | POA: Insufficient documentation

## 2019-06-08 DIAGNOSIS — M1990 Unspecified osteoarthritis, unspecified site: Secondary | ICD-10-CM | POA: Insufficient documentation

## 2019-06-08 DIAGNOSIS — Z90710 Acquired absence of both cervix and uterus: Secondary | ICD-10-CM | POA: Insufficient documentation

## 2019-06-08 DIAGNOSIS — Z91048 Other nonmedicinal substance allergy status: Secondary | ICD-10-CM | POA: Diagnosis not present

## 2019-06-08 DIAGNOSIS — I10 Essential (primary) hypertension: Secondary | ICD-10-CM | POA: Diagnosis not present

## 2019-06-08 DIAGNOSIS — J449 Chronic obstructive pulmonary disease, unspecified: Secondary | ICD-10-CM | POA: Diagnosis not present

## 2019-06-08 HISTORY — PX: CATARACT EXTRACTION W/PHACO: SHX586

## 2019-06-08 SURGERY — PHACOEMULSIFICATION, CATARACT, WITH IOL INSERTION
Anesthesia: Monitor Anesthesia Care | Site: Eye | Laterality: Left

## 2019-06-08 MED ORDER — ARMC OPHTHALMIC DILATING DROPS
1.0000 "application " | OPHTHALMIC | Status: DC | PRN
  Administered 2019-06-08 (×3): 1 via OPHTHALMIC

## 2019-06-08 MED ORDER — ACETAMINOPHEN 160 MG/5ML PO SOLN: 325.0000 mg | ORAL | Status: DC | PRN

## 2019-06-08 MED ORDER — MIDAZOLAM HCL 2 MG/2ML IJ SOLN
INTRAMUSCULAR | Status: DC | PRN
  Administered 2019-06-08: 2 mg via INTRAVENOUS

## 2019-06-08 MED ORDER — NA HYALUR & NA CHOND-NA HYALUR 0.4-0.35 ML IO KIT
PACK | INTRAOCULAR | Status: DC | PRN
  Administered 2019-06-08: 1 mL via INTRAOCULAR

## 2019-06-08 MED ORDER — FENTANYL CITRATE (PF) 100 MCG/2ML IJ SOLN
INTRAMUSCULAR | Status: DC | PRN
  Administered 2019-06-08 (×2): 50 ug via INTRAVENOUS

## 2019-06-08 MED ORDER — LIDOCAINE HCL (PF) 2 % IJ SOLN
INTRAOCULAR | Status: DC | PRN
  Administered 2019-06-08: 2 mL via INTRAMUSCULAR

## 2019-06-08 MED ORDER — ACETAMINOPHEN 325 MG PO TABS: 325.0000 mg | ORAL_TABLET | ORAL | Status: DC | PRN

## 2019-06-08 MED ORDER — EPINEPHRINE PF 1 MG/ML IJ SOLN
INTRAOCULAR | Status: DC | PRN
  Administered 2019-06-08: 11:00:00 60 mL via OPHTHALMIC

## 2019-06-08 MED ORDER — TETRACAINE HCL 0.5 % OP SOLN
1.0000 [drp] | OPHTHALMIC | Status: DC | PRN
  Administered 2019-06-08 (×3): 1 [drp] via OPHTHALMIC

## 2019-06-08 MED ORDER — CEFUROXIME OPHTHALMIC INJECTION 1 MG/0.1 ML
INJECTION | OPHTHALMIC | Status: DC | PRN
  Administered 2019-06-08: 0.1 mL via INTRACAMERAL

## 2019-06-08 MED ORDER — MOXIFLOXACIN HCL 0.5 % OP SOLN
1.0000 [drp] | OPHTHALMIC | Status: DC | PRN
  Administered 2019-06-08 (×3): 1 [drp] via OPHTHALMIC

## 2019-06-08 MED ORDER — BRIMONIDINE TARTRATE-TIMOLOL 0.2-0.5 % OP SOLN
OPHTHALMIC | Status: DC | PRN
  Administered 2019-06-08: 1 [drp] via OPHTHALMIC

## 2019-06-08 SURGICAL SUPPLY — 16 items
CANNULA ANT/CHMB 27G (MISCELLANEOUS) ×1 IMPLANT
CANNULA ANT/CHMB 27GA (MISCELLANEOUS) ×2 IMPLANT
GLOVE SURG LX 7.5 STRW (GLOVE) ×1
GLOVE SURG LX STRL 7.5 STRW (GLOVE) ×1 IMPLANT
GLOVE SURG TRIUMPH 8.0 PF LTX (GLOVE) ×2 IMPLANT
GOWN STRL REUS W/ TWL LRG LVL3 (GOWN DISPOSABLE) ×2 IMPLANT
GOWN STRL REUS W/TWL LRG LVL3 (GOWN DISPOSABLE) ×2
LENS IOL TECNIS ITEC 27.5 (Intraocular Lens) ×1 IMPLANT
MARKER SKIN DUAL TIP RULER LAB (MISCELLANEOUS) ×2 IMPLANT
PACK CATARACT BRASINGTON (MISCELLANEOUS) ×2 IMPLANT
PACK EYE AFTER SURG (MISCELLANEOUS) ×2 IMPLANT
PACK OPTHALMIC (MISCELLANEOUS) ×2 IMPLANT
SYR 3ML LL SCALE MARK (SYRINGE) ×2 IMPLANT
SYR TB 1ML LUER SLIP (SYRINGE) ×2 IMPLANT
WATER STERILE IRR 500ML POUR (IV SOLUTION) ×2 IMPLANT
WIPE NON LINTING 3.25X3.25 (MISCELLANEOUS) ×2 IMPLANT

## 2019-06-08 NOTE — Anesthesia Postprocedure Evaluation (Signed)
Anesthesia Post Note  Patient: Monserat L Sweetser  Procedure(s) Performed: CATARACT EXTRACTION PHACO AND INTRAOCULAR LENS PLACEMENT (IOC) LEFT 9.60,    01:08.5,    14.0% (Left Eye)     Patient location during evaluation: PACU Anesthesia Type: MAC Level of consciousness: awake and alert Pain management: pain level controlled Vital Signs Assessment: post-procedure vital signs reviewed and stable Respiratory status: spontaneous breathing, nonlabored ventilation, respiratory function stable and patient connected to nasal cannula oxygen Cardiovascular status: stable and blood pressure returned to baseline Postop Assessment: no apparent nausea or vomiting Anesthetic complications: no    Trecia Rogers

## 2019-06-08 NOTE — Op Note (Signed)
OPERATIVE NOTE  Lisa Cowan [id number] 06/08/2019   PREOPERATIVE DIAGNOSIS:  Nuclear sclerotic cataract left eye. H25.12   POSTOPERATIVE DIAGNOSIS:    Nuclear sclerotic cataract left eye.     PROCEDURE:  Phacoemusification with posterior chamber intraocular lens placement of the left eye  Ultrasound time: Procedure(s): CATARACT EXTRACTION PHACO AND INTRAOCULAR LENS PLACEMENT (IOC) LEFT 9.60,    01:08.5,    14.0% (Left)  LENS:   Implant Name Type Inv. Item Serial No. Manufacturer Lot No. LRB No. Used Action  LENS IOL DIOP 27.5 - [id number] Intraocular Lens LENS IOL DIOP 27.5 [id number] AMO  Left 1 Implanted      SURGEON:  Wyonia Hough, MD   ANESTHESIA:  Topical with tetracaine drops and 2% Xylocaine jelly, augmented with 1% preservative-free intracameral lidocaine.    COMPLICATIONS:  None.   DESCRIPTION OF PROCEDURE:  The patient was identified in the holding room and transported to the operating room and placed in the supine position under the operating microscope.  The left eye was identified as the operative eye and it was prepped and draped in the usual sterile ophthalmic fashion.   A 1 millimeter clear-corneal paracentesis was made at the 1:30 position.  0.5 ml of preservative-free 1% lidocaine was injected into the anterior chamber.  The anterior chamber was filled with Viscoat viscoelastic.  A 2.4 millimeter keratome was used to make a near-clear corneal incision at the 10:30 position.  .  A curvilinear capsulorrhexis was made with a cystotome and capsulorrhexis forceps.  Balanced salt solution was used to hydrodissect and hydrodelineate the nucleus.   Phacoemulsification was then used in stop and chop fashion to remove the lens nucleus and epinucleus.  The remaining cortex was then removed using the irrigation and aspiration handpiece. Provisc was then placed into the capsular bag to distend it for lens placement.  A lens was then injected into the capsular bag.   The remaining viscoelastic was aspirated.   Wounds were hydrated with balanced salt solution.  The anterior chamber was inflated to a physiologic pressure with balanced salt solution.  No wound leaks were noted. Cefuroxime 0.1 ml of a 10mg /ml solution was injected into the anterior chamber for a dose of 1 mg of intracameral antibiotic at the completion of the case.   Timolol and Brimonidine drops were applied to the eye.  The patient was taken to the recovery room in stable condition without complications of anesthesia or surgery.  [name] 06/08/2019, 11:12 AM

## 2019-06-08 NOTE — H&P (Signed)

## 2019-06-08 NOTE — Anesthesia Preprocedure Evaluation (Signed)
Anesthesia Evaluation  Patient identified by MRN, date of birth, ID band Patient awake    Reviewed: Allergy & Precautions, H&P , NPO status , Patient's Chart, lab work & pertinent test results, reviewed documented beta blocker date and time   Airway Mallampati: II  TM Distance: >3 FB Neck ROM: full    Dental no notable dental hx.    Pulmonary sleep apnea and Continuous Positive Airway Pressure Ventilation , COPD,  COPD inhaler, former smoker,    Pulmonary exam normal breath sounds clear to auscultation       Cardiovascular Exercise Tolerance: Good hypertension, Normal cardiovascular exam Rhythm:regular Rate:Normal     Neuro/Psych negative neurological ROS  negative psych ROS   GI/Hepatic Neg liver ROS, GERD  ,  Endo/Other  negative endocrine ROS  Renal/GU negative Renal ROS  negative genitourinary   Musculoskeletal   Abdominal   Peds  Hematology negative hematology ROS (+)   Anesthesia Other Findings   Reproductive/Obstetrics negative OB ROS                             Anesthesia Physical Anesthesia Plan  ASA: III  Anesthesia Plan: MAC   Post-op Pain Management:    Induction:   PONV Risk Score and Plan:   Airway Management Planned:   Additional Equipment:   Intra-op Plan:   Post-operative Plan:   Informed Consent: I have reviewed the patients History and Physical, chart, labs and discussed the procedure including the risks, benefits and alternatives for the proposed anesthesia with the patient or authorized representative who has indicated his/her understanding and acceptance.     Dental Advisory Given  Plan Discussed with: CRNA  Anesthesia Plan Comments:         Anesthesia Quick Evaluation

## 2019-06-08 NOTE — Transfer of Care (Signed)
Immediate Anesthesia Transfer of Care Note  Patient: Lisa Cowan  Procedure(s) Performed: CATARACT EXTRACTION PHACO AND INTRAOCULAR LENS PLACEMENT (IOC) LEFT 9.60,    01:08.5,    14.0% (Left Eye)  Patient Location: PACU  Anesthesia Type: MAC  Level of Consciousness: awake, alert  and patient cooperative  Airway and Oxygen Therapy: Patient Spontanous Breathing and Patient connected to supplemental oxygen  Post-op Assessment: Post-op Vital signs reviewed, Patient's Cardiovascular Status Stable, Respiratory Function Stable, Patent Airway and No signs of Nausea or vomiting  Post-op Vital Signs: Reviewed and stable  Complications: No apparent anesthesia complications

## 2019-06-09 ENCOUNTER — Encounter: Payer: Self-pay | Admitting: Ophthalmology

## 2019-07-26 DIAGNOSIS — J439 Emphysema, unspecified: Secondary | ICD-10-CM | POA: Diagnosis not present

## 2019-07-26 DIAGNOSIS — G4733 Obstructive sleep apnea (adult) (pediatric): Secondary | ICD-10-CM | POA: Diagnosis not present

## 2019-07-26 DIAGNOSIS — Z6837 Body mass index (BMI) 37.0-37.9, adult: Secondary | ICD-10-CM | POA: Diagnosis not present

## 2019-07-26 DIAGNOSIS — I10 Essential (primary) hypertension: Secondary | ICD-10-CM | POA: Diagnosis not present

## 2019-07-26 DIAGNOSIS — F3340 Major depressive disorder, recurrent, in remission, unspecified: Secondary | ICD-10-CM | POA: Diagnosis not present

## 2019-07-26 DIAGNOSIS — M5137 Other intervertebral disc degeneration, lumbosacral region: Secondary | ICD-10-CM | POA: Diagnosis not present

## 2019-07-26 DIAGNOSIS — R7309 Other abnormal glucose: Secondary | ICD-10-CM | POA: Diagnosis not present

## 2019-08-12 ENCOUNTER — Other Ambulatory Visit: Payer: Self-pay | Admitting: Internal Medicine

## 2019-08-12 DIAGNOSIS — E278 Other specified disorders of adrenal gland: Secondary | ICD-10-CM | POA: Diagnosis not present

## 2019-08-12 DIAGNOSIS — K219 Gastro-esophageal reflux disease without esophagitis: Secondary | ICD-10-CM | POA: Diagnosis not present

## 2019-08-12 DIAGNOSIS — R7309 Other abnormal glucose: Secondary | ICD-10-CM | POA: Diagnosis not present

## 2019-08-12 DIAGNOSIS — M5137 Other intervertebral disc degeneration, lumbosacral region: Secondary | ICD-10-CM | POA: Diagnosis not present

## 2019-08-12 DIAGNOSIS — I10 Essential (primary) hypertension: Secondary | ICD-10-CM | POA: Diagnosis not present

## 2019-08-12 DIAGNOSIS — G4733 Obstructive sleep apnea (adult) (pediatric): Secondary | ICD-10-CM | POA: Diagnosis not present

## 2019-08-12 DIAGNOSIS — Z0000 Encounter for general adult medical examination without abnormal findings: Secondary | ICD-10-CM | POA: Diagnosis not present

## 2019-08-12 DIAGNOSIS — Z1231 Encounter for screening mammogram for malignant neoplasm of breast: Secondary | ICD-10-CM

## 2019-08-12 DIAGNOSIS — F3340 Major depressive disorder, recurrent, in remission, unspecified: Secondary | ICD-10-CM | POA: Diagnosis not present

## 2019-08-18 DIAGNOSIS — G4733 Obstructive sleep apnea (adult) (pediatric): Secondary | ICD-10-CM | POA: Diagnosis not present

## 2019-09-15 DIAGNOSIS — I10 Essential (primary) hypertension: Secondary | ICD-10-CM | POA: Diagnosis not present

## 2019-09-15 DIAGNOSIS — R0602 Shortness of breath: Secondary | ICD-10-CM | POA: Diagnosis not present

## 2019-09-15 DIAGNOSIS — G4733 Obstructive sleep apnea (adult) (pediatric): Secondary | ICD-10-CM | POA: Diagnosis not present

## 2019-09-15 DIAGNOSIS — J439 Emphysema, unspecified: Secondary | ICD-10-CM | POA: Diagnosis not present

## 2019-09-19 ENCOUNTER — Encounter (INDEPENDENT_AMBULATORY_CARE_PROVIDER_SITE_OTHER): Payer: Self-pay

## 2019-09-19 ENCOUNTER — Ambulatory Visit
Admission: RE | Admit: 2019-09-19 | Discharge: 2019-09-19 | Disposition: A | Discharge status: Home / Self Care | Payer: PPO | Source: Ambulatory Visit | Attending: Specialist | Admitting: Specialist

## 2019-09-19 ENCOUNTER — Other Ambulatory Visit: Payer: Self-pay

## 2019-09-19 DIAGNOSIS — R918 Other nonspecific abnormal finding of lung field: Secondary | ICD-10-CM | POA: Diagnosis not present

## 2019-09-19 DIAGNOSIS — R911 Solitary pulmonary nodule: Secondary | ICD-10-CM | POA: Insufficient documentation

## 2019-09-22 DIAGNOSIS — Z6837 Body mass index (BMI) 37.0-37.9, adult: Secondary | ICD-10-CM | POA: Diagnosis not present

## 2019-09-22 DIAGNOSIS — E663 Overweight: Secondary | ICD-10-CM | POA: Diagnosis not present

## 2019-09-22 DIAGNOSIS — R911 Solitary pulmonary nodule: Secondary | ICD-10-CM | POA: Diagnosis not present

## 2019-09-26 ENCOUNTER — Other Ambulatory Visit: Payer: Self-pay | Admitting: Internal Medicine

## 2019-09-27 ENCOUNTER — Other Ambulatory Visit: Payer: Self-pay | Admitting: Internal Medicine

## 2019-09-27 DIAGNOSIS — R590 Localized enlarged lymph nodes: Secondary | ICD-10-CM

## 2019-10-06 ENCOUNTER — Ambulatory Visit
Admission: RE | Admit: 2019-10-06 | Discharge: 2019-10-06 | Disposition: A | Discharge status: Home / Self Care | Payer: PPO | Source: Ambulatory Visit | Attending: Internal Medicine | Admitting: Internal Medicine

## 2019-10-06 DIAGNOSIS — R590 Localized enlarged lymph nodes: Secondary | ICD-10-CM

## 2019-10-06 DIAGNOSIS — R928 Other abnormal and inconclusive findings on diagnostic imaging of breast: Secondary | ICD-10-CM | POA: Diagnosis not present

## 2019-10-06 DIAGNOSIS — N6489 Other specified disorders of breast: Secondary | ICD-10-CM | POA: Diagnosis not present

## 2019-10-20 DIAGNOSIS — R911 Solitary pulmonary nodule: Secondary | ICD-10-CM | POA: Diagnosis not present

## 2019-10-24 ENCOUNTER — Other Ambulatory Visit: Payer: Self-pay | Admitting: Specialist

## 2019-10-24 DIAGNOSIS — R911 Solitary pulmonary nodule: Secondary | ICD-10-CM

## 2019-10-28 ENCOUNTER — Emergency Department
Admission: EM | Admit: 2019-10-28 | Discharge: 2019-10-29 | Disposition: A | Discharge status: Home / Self Care | Payer: PPO | Attending: Emergency Medicine | Admitting: Emergency Medicine

## 2019-10-28 ENCOUNTER — Emergency Department: Payer: PPO

## 2019-10-28 ENCOUNTER — Encounter: Payer: Self-pay | Admitting: Emergency Medicine

## 2019-10-28 ENCOUNTER — Other Ambulatory Visit: Payer: Self-pay

## 2019-10-28 DIAGNOSIS — J449 Chronic obstructive pulmonary disease, unspecified: Secondary | ICD-10-CM | POA: Diagnosis not present

## 2019-10-28 DIAGNOSIS — Z87891 Personal history of nicotine dependence: Secondary | ICD-10-CM | POA: Diagnosis not present

## 2019-10-28 DIAGNOSIS — I10 Essential (primary) hypertension: Secondary | ICD-10-CM | POA: Insufficient documentation

## 2019-10-28 DIAGNOSIS — Z85828 Personal history of other malignant neoplasm of skin: Secondary | ICD-10-CM | POA: Diagnosis not present

## 2019-10-28 DIAGNOSIS — Z8541 Personal history of malignant neoplasm of cervix uteri: Secondary | ICD-10-CM | POA: Insufficient documentation

## 2019-10-28 DIAGNOSIS — R270 Ataxia, unspecified: Secondary | ICD-10-CM | POA: Diagnosis not present

## 2019-10-28 DIAGNOSIS — R42 Dizziness and giddiness: Secondary | ICD-10-CM | POA: Diagnosis not present

## 2019-10-28 DIAGNOSIS — Z79899 Other long term (current) drug therapy: Secondary | ICD-10-CM | POA: Diagnosis not present

## 2019-10-28 DIAGNOSIS — R0689 Other abnormalities of breathing: Secondary | ICD-10-CM | POA: Diagnosis not present

## 2019-10-28 DIAGNOSIS — R0902 Hypoxemia: Secondary | ICD-10-CM | POA: Diagnosis not present

## 2019-10-28 LAB — CBC
HCT: 37.6 % (ref 36.0–46.0)
Hemoglobin: 12.8 g/dL (ref 12.0–15.0)
MCH: 29.2 pg (ref 26.0–34.0)
MCHC: 34 g/dL (ref 30.0–36.0)
MCV: 85.6 fL (ref 80.0–100.0)
Platelets: 246 10*3/uL (ref 150–400)
RBC: 4.39 MIL/uL (ref 3.87–5.11)
RDW: 13.7 % (ref 11.5–15.5)
WBC: 4.8 10*3/uL (ref 4.0–10.5)
nRBC: 0 % (ref 0.0–0.2)

## 2019-10-28 LAB — BASIC METABOLIC PANEL
Anion gap: 12 (ref 5–15)
BUN: 13 mg/dL (ref 8–23)
CO2: 28 mmol/L (ref 22–32)
Calcium: 9.2 mg/dL (ref 8.9–10.3)
Chloride: 92 mmol/L — ABNORMAL LOW (ref 98–111)
Creatinine, Ser: 0.86 mg/dL (ref 0.44–1.00)
GFR calc Af Amer: >60 mL/min (ref >60)
GFR calc non Af Amer: >60 mL/min (ref >60)
Glucose, Bld: 107 mg/dL — ABNORMAL HIGH (ref 70–99)
Potassium: 3.5 mmol/L (ref 3.5–5.1)
Sodium: 132 mmol/L — ABNORMAL LOW (ref 135–145)

## 2019-10-28 LAB — URINALYSIS, COMPLETE (UACMP) WITH MICROSCOPIC
Bacteria, UA: NONE SEEN
Bilirubin Urine: NEGATIVE
Glucose, UA: NEGATIVE mg/dL
Hgb urine dipstick: NEGATIVE
Ketones, ur: NEGATIVE mg/dL
Leukocytes,Ua: NEGATIVE
Nitrite: NEGATIVE
Protein, ur: NEGATIVE mg/dL
Specific Gravity, Urine: 1.004 — ABNORMAL LOW (ref 1.005–1.030)
pH: 7 (ref 5.0–8.0)

## 2019-10-28 MED ORDER — SODIUM CHLORIDE 0.9% FLUSH: 3.0000 mL | Freq: Once | INTRAVENOUS | Status: DC

## 2019-10-28 MED ORDER — SODIUM CHLORIDE 0.9 % IV BOLUS
1000.0000 mL | Freq: Once | INTRAVENOUS | Status: AC
  Administered 2019-10-28: 22:00:00 1000 mL via INTRAVENOUS

## 2019-10-28 NOTE — ED Notes (Signed)
Ambulated patient per MD request.  Pt was steady on her feet and claimed she had no dizziness.  MD aware

## 2019-10-28 NOTE — ED Triage Notes (Signed)
Pt to ED via [facility] from home for dizziness and weakness that started last night. Pt states that dizziness is worse with position changes. Pt CBG 138, HR 74, SpO2 96% on room air, pt given approx. 100 cc of fluids. Pt is in NAD.

## 2019-10-28 NOTE — ED Provider Notes (Signed)
[facility] Emergency Department Provider Note  ____________________________________________   First MD Initiated Contact with Patient 10/28/19 2024     (approximate)  I have reviewed the triage vital signs and the nursing notes.   HISTORY  Chief Complaint Dizziness    HPI Lisa Cowan is a 79 y.o. female with past medical history as below here with mild dizziness.  The patient states that for the last 24 hours, she has felt generally dizzy.  She has felt somewhat off balance.  She has had difficulty walking due to feeling off balance.  She also felt somewhat like she was having a hard time staying in the lines while driving.  Her dizziness is worse with sitting up and moving her head.  No tinnitus.  No focal numbness or weakness.  She has had slightly  slurred speech.       Past Medical History:  Diagnosis Date  . Adrenal nodule ([place])   . Anemia   . Anginal pain ([place])   . Arthritis   . Cancer ([place])    cervical ca  . COPD (chronic obstructive pulmonary disease) ([place])   . Depression   . GERD (gastroesophageal reflux disease)   . Headache   . Hyperlipidemia   . Hypertension   . Skin cancer   . Sleep apnea    CPAP    Patient Active Problem List   Diagnosis Date Noted  . Chest pain 06/05/2018  . HTN (hypertension) 06/05/2018  . HLD (hyperlipidemia) 06/05/2018  . OSA (obstructive sleep apnea) 06/05/2018  . GERD (gastroesophageal reflux disease) 06/05/2018  . COPD (chronic obstructive pulmonary disease) ([place]) 06/05/2018    Past Surgical History:  Procedure Laterality Date  . ABDOMINAL HYSTERECTOMY    . ADENOIDECTOMY    . BREAST BIOPSY Right 01/04/2016   stereo  . CARDIAC CATHETERIZATION Left 08/21/2016   Procedure: Left Heart Cath and Coronary Angiography;  Surgeon: Isaias Cowman, MD;  Location: [facility];  Service: Cardiovascular;  Laterality: Left;  . CATARACT EXTRACTION W/PHACO Right 05/18/2019   Procedure: CATARACT  EXTRACTION PHACO AND INTRAOCULAR LENS PLACEMENT (IOC) Right  00:52.2  18.7%  9.59;  Surgeon: Leandrew Koyanagi, MD;  Location: [facility];  Service: Ophthalmology;  Laterality: Right;  sleep apnea  . CATARACT EXTRACTION W/PHACO Left 06/08/2019   Procedure: CATARACT EXTRACTION PHACO AND INTRAOCULAR LENS PLACEMENT (IOC) LEFT 9.60,    01:08.5,    14.0%;  Surgeon: Leandrew Koyanagi, MD;  Location: [facility];  Service: Ophthalmology;  Laterality: Left;  . COLONOSCOPY WITH PROPOFOL N/A 08/07/2017   Procedure: COLONOSCOPY WITH PROPOFOL;  Surgeon: Manya Silvas, MD;  Location: [facility]COPY;  Service: Endoscopy;  Laterality: N/A;  . ESOPHAGOGASTRODUODENOSCOPY (EGD) WITH PROPOFOL N/A 08/07/2017   Procedure: ESOPHAGOGASTRODUODENOSCOPY (EGD) WITH PROPOFOL;  Surgeon: Manya Silvas, MD;  Location: [facility] ENDOSCOPY;  Service: Endoscopy;  Laterality: N/A;  . ROTATOR CUFF REPAIR Left   . TONSILLECTOMY      Prior to Admission medications   Medication Sig Start Date End Date Taking? Authorizing Provider  albuterol (PROVENTIL HFA;VENTOLIN HFA) 108 (90 Base) MCG/ACT inhaler Inhale 2 puffs into the lungs every 4 (four) hours as needed for wheezing or shortness of breath.    [provider]  Ascorbic Acid (VITAMIN C PO) Take by mouth.    [provider]  atenolol (TENORMIN) 25 MG tablet Take 25 mg by mouth daily.    [provider]  Cyanocobalamin 2500 MCG CHEW Chew 2,500 mcg by  mouth daily.    [provider]  FLUoxetine (PROZAC) 20 MG capsule Take 20 mg by mouth daily.    [provider]  gabapentin (NEURONTIN) 300 MG capsule Take 300 mg by mouth 2 (two) times daily.    [provider]  meclizine (ANTIVERT) 12.5 MG tablet Take 1 tablet (12.5 mg total) by mouth 2 (two) times daily as needed for dizziness. 10/29/19   Duffy Bruce, MD  meloxicam (MOBIC) 7.5 MG tablet Take 7.5 mg by mouth daily.    [provider]  Multiple  Vitamin (MULTIVITAMIN WITH MINERALS) TABS tablet Take 1 tablet by mouth daily.    [provider]  omeprazole (PRILOSEC) 20 MG capsule Take 20 mg by mouth 2 (two) times daily before a meal.    [provider]  rosuvastatin (CRESTOR) 5 MG tablet Take 5 mg by mouth every Monday, Wednesday, and Friday.  03/17/18   [provider]  SPIRIVA RESPIMAT 2.5 MCG/ACT AERS Inhale 1 puff into the lungs daily. 05/30/18   [provider]  tiZANidine (ZANAFLEX) 4 MG tablet Take 4 mg by mouth at bedtime as needed for muscle spasms. 05/25/18   [provider]  triamterene-hydrochlorothiazide (DYAZIDE) 37.5-25 MG capsule Take 1 capsule by mouth daily.    [provider]    Allergies Codeine, Hydrocodone-acetaminophen, Nucynta [tapentadol], Oxycodone-acetaminophen, Statins, and Adhesive [tape]  Family History  Problem Relation Age of Onset  . Breast cancer Sister 70    Social History Social History   Tobacco Use  . Smoking status: Former Smoker    Quit date: 2000    Years since quitting: 21.2  . Smokeless tobacco: Never Used  Substance Use Topics  . Alcohol use: Yes    Comment: occassionally  . Drug use: No    Review of Systems  Review of Systems  Constitutional: Positive for fatigue. Negative for fever.  HENT: Negative for congestion and sore throat.   Eyes: Negative for visual disturbance.  Respiratory: Negative for cough and shortness of breath.   Cardiovascular: Negative for chest pain.  Gastrointestinal: Negative for abdominal pain, diarrhea, nausea and vomiting.  Genitourinary: Negative for flank pain.  Musculoskeletal: Negative for back pain and neck pain.  Skin: Negative for rash and wound.  Neurological: Positive for dizziness. Negative for weakness.  All other systems reviewed and are negative.    ____________________________________________  PHYSICAL EXAM:      VITAL SIGNS: ED Triage Vitals  Enc Vitals Group     BP  10/28/19 1630 (!) 154/76     Pulse Rate 10/28/19 1630 69     Resp 10/28/19 1630 16     Temp 10/28/19 1630 (!) 97.4 F (36.3 C)     Temp Source 10/28/19 1630 Oral     SpO2 10/28/19 1630 95 %     Weight 10/28/19 1632 185 lb (83.9 kg)     Height 10/28/19 1632 4\' 11"  (1.499 m)     Head Circumference --      Peak Flow --      Pain Score 10/28/19 1632 0     Pain Loc --      Pain Edu? --      Excl. in [place]? --      Physical Exam Vitals and nursing note reviewed.  Constitutional:      General: She is not in acute distress.    Appearance: She is well-developed.  HENT:     Head: Normocephalic and atraumatic.  Eyes:  Conjunctiva/sclera: Conjunctivae normal.  Cardiovascular:     Rate and Rhythm: Normal rate and regular rhythm.     Heart sounds: Normal heart sounds. No murmur. No friction rub.  Pulmonary:     Effort: Pulmonary effort is normal. No respiratory distress.     Breath sounds: Normal breath sounds. No wheezing or rales.  Abdominal:     General: There is no distension.     Palpations: Abdomen is soft.     Tenderness: There is no abdominal tenderness.  Musculoskeletal:     Cervical back: Neck supple.  Skin:    General: Skin is warm.     Capillary Refill: Capillary refill takes less than 2 seconds.     Findings: No rash.  Neurological:     Mental Status: She is alert and oriented to person, place, and time.     Motor: No abnormal muscle tone.     Comments: Neurological Exam:  Mental Status: Alert and oriented to person, place, and time. Attention and concentration normal. Speech clear. Recent memory is intact. Cranial Nerves: Visual fields grossly intact. EOMI and PERRLA. No nystagmus noted. Facial sensation intact at forehead, maxillary cheek, and chin/mandible bilaterally. No facial asymmetry or weakness. Hearing grossly normal. Uvula is midline, and palate elevates symmetrically. Normal SCM and trapezius strength. Tongue midline without fasciculations. Motor: Muscle  strength 5/5 in proximal and distal UE and LE bilaterally. No pronator drift. Muscle tone normal.  Sensation: Intact to light touch in upper and lower extremities distally bilaterally.  Gait: Normal without ataxia. Coordination: Normal FTN bilaterally.          ____________________________________________   LABS (all labs ordered are listed, but only abnormal results are displayed)  Labs Reviewed  BASIC METABOLIC PANEL - Abnormal; Notable for the following components:      Result Value   Sodium 132 (*)    Chloride 92 (*)    Glucose, Bld 107 (*)    All other components within normal limits  URINALYSIS, COMPLETE (UACMP) WITH MICROSCOPIC - Abnormal; Notable for the following components:   Color, Urine STRAW (*)    APPearance CLEAR (*)    Specific Gravity, Urine 1.004 (*)    All other components within normal limits  CBC  CBG MONITORING, ED    ____________________________________________  EKG: Normal sinus rhythm, with PVCs. VR 73, QRS 182, QTc 456. No ST elevations or depressions. No ischemia. ________________________________________  RADIOLOGY All imaging, including plain films, CT scans, and ultrasounds, independently reviewed by me, and interpretations confirmed via formal radiology reads.  ED MD interpretation:   CT Head: NAICA MRI: neg for acute infarct  Official radiology report(s): CT Head Wo Contrast  Result Date: 10/28/2019 CLINICAL DATA:  79 year old female with ataxia. Concern for stroke. EXAM: CT HEAD WITHOUT CONTRAST TECHNIQUE: Contiguous axial images were obtained from the base of the skull through the vertex without intravenous contrast. COMPARISON:  None. FINDINGS: Brain: There is mild age-related atrophy and chronic microvascular ischemic changes. There is no acute intracranial hemorrhage. No mass effect or midline shift. No extra-axial fluid collection. Vascular: No hyperdense vessel or unexpected calcification. Skull: Normal. Negative for fracture or  focal lesion. Sinuses/Orbits: No acute finding. Other: None IMPRESSION: 1. No acute intracranial pathology. 2. Mild age-related atrophy and chronic microvascular ischemic changes. Electronically Signed   By: Anner Crete M.D.   On: 10/28/2019 21:25   MR BRAIN WO CONTRAST  Result Date: 10/28/2019 CLINICAL DATA:  79 year old female with dizziness, weakness, ataxia. EXAM: MRI HEAD WITHOUT CONTRAST  TECHNIQUE: Multiplanar, multiecho pulse sequences of the brain and surrounding structures were obtained without intravenous contrast. COMPARISON:  Head CT earlier tonight. FINDINGS: Brain: Cerebral volume is within normal limits for age. No restricted diffusion to suggest acute infarction. No midline shift, mass effect, evidence of mass lesion, ventriculomegaly, extra-axial collection or acute intracranial hemorrhage. Cervicomedullary junction and pituitary are within normal limits. Scattered cerebral white matter T2 and FLAIR hyperintensity, especially in the bilateral corona radiata. There is some involvement of the bilateral basal ganglia greater on the left. No superimposed cortical encephalomalacia or chronic cerebral blood products. The thalami, brainstem and cerebellum appear normal. Vascular: Major intracranial vascular flow voids are preserved. The right vertebral artery appears dominant. Skull and upper cervical spine: Negative visible cervical spine. Visualized bone marrow signal is within normal limits. Sinuses/Orbits: Postoperative changes to both globes, otherwise negative orbits. Paranasal Visualized paranasal sinuses and mastoids are stable and well pneumatized. Other: Grossly normal visible internal auditory structures. Normal stylomastoid foramina. Scalp and face soft tissues appear negative. IMPRESSION: 1. No acute intracranial abnormality. 2. 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  Electronically Signed   By: Genevie Ann M.D.   On: 10/28/2019 22:59    ____________________________________________  PROCEDURES   Procedure(s) performed (including Critical Care):  Procedures  ____________________________________________  INITIAL IMPRESSION / MDM / [place] / ED COURSE  As part of my medical decision making, I reviewed the following data within the [facility] notes reviewed and incorporated, Old chart reviewed, Notes from prior ED visits, and [place] Controlled Substance Database       *Lisa Cowan was evaluated in Emergency Department on 10/29/2019 for the symptoms described in the history of present illness. She was evaluated in the context of the global COVID-19 pandemic, which necessitated consideration that the patient might be at risk for infection with the SARS-CoV-2 virus that causes COVID-19. Institutional protocols and algorithms that pertain to the evaluation of patients at risk for COVID-19 are in a state of rapid change based on information released by regulatory bodies including the CDC and federal and state organizations. These policies and algorithms were followed during the patient's care in the ED.  Some ED evaluations and interventions may be delayed as a result of limited staffing during the pandemic.*     Medical Decision Making:  Very pleasant 79 yo F here with mild dizziness upon standing, sense of loss of balance. On exam, she initially had slight horizontal nystagmus but this has resolved on repeat examinations. No dysmetria. CT head and MRI negative for acute abnormality. Lab work is overall unremarkable and patient feels markedly improved after fluids in the ED.  Unclear if pt's dizziness/lightheadedness represents mild orthostasis 2/2 dehydration given its positional nature, versus a mild peripheral vertigo. No other signs of cerebellar or posterior ischemia and given duration of sx >24 hr at time of negative MRI,  doubt CVA. EKG is nonischemic, she has no CP, and no arrhythmia on telemetry - do not suspect cardiac etiology.  Following fluids, pt ambulatory in ED without difficulty and tolerating PO. Will encourage fluids and have her f/u with PCP for ongoing work-up, with trial of low-dose meclizine PRN for possible mild peripheral vertigo. Return precautions given. She will stay with her daughter, who is an Therapist, sports, to monitor her sx. Would also consider starting a low dose aspirin in the event of atypical TIA presentation,  until further evaluation with her PCP.  ____________________________________________  FINAL CLINICAL IMPRESSION(S) / ED DIAGNOSES  Final diagnoses:  Dizziness  Vertigo     MEDICATIONS GIVEN DURING THIS VISIT:  Medications  sodium chloride flush (NS) 0.9 % injection 3 mL (has no administration in time range)  sodium chloride 0.9 % bolus 1,000 mL (0 mLs Intravenous Stopped 10/29/19 0016)  meclizine (ANTIVERT) tablet 12.5 mg (12.5 mg Oral Given 10/29/19 0026)     ED Discharge Orders         Ordered    meclizine (ANTIVERT) 12.5 MG tablet  2 times daily PRN     10/29/19 0002           Note:  This document was prepared using Dragon voice recognition software and may include unintentional dictation errors.   Duffy Bruce, MD 10/29/19 [phone]

## 2019-10-28 NOTE — ED Notes (Signed)
Pt reports she feels better than initial assessment. And reports decreased dizziness with ambulation.

## 2019-10-28 NOTE — ED Notes (Signed)
Helped pt with bedpan at her request.

## 2019-10-29 MED ORDER — MECLIZINE HCL 12.5 MG PO TABS: 12.5000 mg | ORAL_TABLET | Freq: Two times a day (BID) | ORAL | 0 refills | Status: AC | PRN

## 2019-10-29 MED ORDER — ASPIRIN EC 81 MG PO TBEC: 81.0000 mg | DELAYED_RELEASE_TABLET | Freq: Every day | ORAL | 0 refills | Status: AC

## 2019-10-29 MED ORDER — MECLIZINE HCL 25 MG PO TABS
12.5000 mg | ORAL_TABLET | Freq: Once | ORAL | Status: AC
  Administered 2019-10-29: 12.5 mg via ORAL
  Filled 2019-10-29: qty 1

## 2019-10-29 NOTE — Discharge Instructions (Addendum)
Drink at least 6-8 glasses of water daily  Take Meclizine as needed for dizziness. Be careful, this can make you drowsy.   I'd recommend having someone at home with you for the next day or two to help.  I would also recommend starting a low-dose aspirin, while working this up with your doctor.  Be very careful when going from sitting to standing.

## 2019-11-03 ENCOUNTER — Ambulatory Visit
Admission: RE | Admit: 2019-11-03 | Discharge: 2019-11-03 | Disposition: A | Discharge status: Home / Self Care | Payer: PPO | Source: Ambulatory Visit | Attending: Specialist | Admitting: Specialist

## 2019-11-03 ENCOUNTER — Other Ambulatory Visit: Payer: Self-pay

## 2019-11-03 DIAGNOSIS — R42 Dizziness and giddiness: Secondary | ICD-10-CM | POA: Diagnosis not present

## 2019-11-03 DIAGNOSIS — I10 Essential (primary) hypertension: Secondary | ICD-10-CM | POA: Diagnosis not present

## 2019-11-03 DIAGNOSIS — N61 Mastitis without abscess: Secondary | ICD-10-CM | POA: Diagnosis not present

## 2019-11-03 DIAGNOSIS — R918 Other nonspecific abnormal finding of lung field: Secondary | ICD-10-CM | POA: Insufficient documentation

## 2019-11-03 DIAGNOSIS — E118 Type 2 diabetes mellitus with unspecified complications: Secondary | ICD-10-CM | POA: Insufficient documentation

## 2019-11-03 DIAGNOSIS — M5137 Other intervertebral disc degeneration, lumbosacral region: Secondary | ICD-10-CM | POA: Diagnosis not present

## 2019-11-03 DIAGNOSIS — R911 Solitary pulmonary nodule: Secondary | ICD-10-CM

## 2019-11-03 DIAGNOSIS — F3340 Major depressive disorder, recurrent, in remission, unspecified: Secondary | ICD-10-CM | POA: Diagnosis not present

## 2019-11-03 DIAGNOSIS — J439 Emphysema, unspecified: Secondary | ICD-10-CM | POA: Diagnosis not present

## 2019-11-03 LAB — GLUCOSE, CAPILLARY: Glucose-Capillary: 103 mg/dL — ABNORMAL HIGH (ref 70–99)

## 2019-11-03 MED ORDER — FLUDEOXYGLUCOSE F - 18 (FDG) INJECTION
9.6000 | Freq: Once | INTRAVENOUS | Status: AC | PRN
  Administered 2019-11-03: 10.343 via INTRAVENOUS

## 2019-11-10 DIAGNOSIS — J439 Emphysema, unspecified: Secondary | ICD-10-CM | POA: Diagnosis not present

## 2019-11-10 DIAGNOSIS — G4733 Obstructive sleep apnea (adult) (pediatric): Secondary | ICD-10-CM | POA: Diagnosis not present

## 2019-11-10 DIAGNOSIS — R0600 Dyspnea, unspecified: Secondary | ICD-10-CM | POA: Diagnosis not present

## 2019-11-10 DIAGNOSIS — R918 Other nonspecific abnormal finding of lung field: Secondary | ICD-10-CM | POA: Diagnosis not present

## 2019-11-17 DIAGNOSIS — Z01818 Encounter for other preprocedural examination: Secondary | ICD-10-CM | POA: Diagnosis not present

## 2019-11-17 DIAGNOSIS — J439 Emphysema, unspecified: Secondary | ICD-10-CM | POA: Diagnosis not present

## 2019-11-23 ENCOUNTER — Other Ambulatory Visit: Payer: Self-pay | Admitting: Pulmonary Disease

## 2019-11-23 DIAGNOSIS — R918 Other nonspecific abnormal finding of lung field: Secondary | ICD-10-CM

## 2019-11-23 DIAGNOSIS — Z01818 Encounter for other preprocedural examination: Secondary | ICD-10-CM

## 2019-11-25 ENCOUNTER — Other Ambulatory Visit: Payer: Self-pay

## 2019-11-25 ENCOUNTER — Encounter
Admission: RE | Admit: 2019-11-25 | Discharge: 2019-11-25 | Disposition: A | Discharge status: Home / Self Care | Payer: PPO | Source: Ambulatory Visit | Attending: Pulmonary Disease | Admitting: Pulmonary Disease

## 2019-11-25 HISTORY — DX: Other specified postprocedural states: Z98.890

## 2019-11-25 HISTORY — DX: Other complications of anesthesia, initial encounter: T88.59XA

## 2019-11-25 HISTORY — DX: Nausea with vomiting, unspecified: R11.2

## 2019-11-25 HISTORY — DX: Dyspnea, unspecified: R06.00

## 2019-11-25 NOTE — Patient Instructions (Addendum)
Your procedure is scheduled on: 12-05-19 MONDAY Report to Same Day Surgery 2nd floor medical mall [facility] Entrance-take elevator on left to 2nd floor.  Check in with surgery information desk.) To find out your arrival time please call [phone] between 1PM - 3PM on 12-02-19 FRIDAY  Remember: Instructions that are not followed completely may result in serious medical risk, up to and including death, or upon the discretion of your surgeon and anesthesiologist your surgery may need to be rescheduled.    _x___ 1. Do not eat food after midnight the night before your procedure. NO GUM OR CANDY AFTER MIDNIGHT. You may drink clear liquids up to 2 hours before you are scheduled to arrive at the hospital for your procedure.  Do not drink clear liquids within 2 hours of your scheduled arrival to the hospital.  Clear liquids include  --Water or Apple juice without pulp  --Gatorade  --Black Coffee or Clear Tea (No milk, no creamers, do not add anything to the coffee or Tea-sugar ok to add)   ____Ensure clear carbohydrate drink on the way to the hospital for bariatric patients  ____Ensure clear carbohydrate drink 3 hours before surgery.    __x__ 2. No Alcohol for 24 hours before or after surgery.   __x__3. No Smoking or e-cigarettes for 24 prior to surgery.  Do not use any chewable tobacco products for at least 6 hour prior to surgery   ____  4. Bring all medications with you on the day of surgery if instructed.    __x__ 5. Notify your doctor if there is any change in your medical condition     (cold, fever, infections).    x___6. On the morning of surgery brush your teeth with toothpaste and water.  You may rinse your mouth with mouth wash if you wish.  Do not swallow any toothpaste or mouthwash.   Do not wear jewelry, make-up, hairpins, clips or nail polish.  Do not wear lotions, powders, or perfumes. You may wear deodorant.  Do not shave 48 hours prior to surgery. Men may shave face  and neck.  Do not bring valuables to the hospital.    [facility] is not responsible for any belongings or valuables.               Contacts, dentures or bridgework may not be worn into surgery.  Leave your suitcase in the car. After surgery it may be brought to your room.  For patients admitted to the hospital, discharge time is determined by your treatment team.  _  Patients discharged the day of surgery will not be allowed to drive home.  You will need someone to drive you home and stay with you the night of your procedure.    Please read over the following fact sheets that you were given:   [facility] Preparing for Surgery  _x___ TAKE THE FOLLOWING MEDICATION THE MORNING OF SURGERY WITH A SMALL SIP OF WATER. These include:  1. PRILOSEC (OMEPRAZOLE)  2. CRESTOR (ROSUVASTATIN)  3. ATENOLOL (TENORNIM)  4. PROZAC (FLUOXETINE)  5. YOU MAY TAKE GABAPENTIN (NEURONTIN) DAY OF SURGERY IF NEEDED  6.  ____Fleets enema or Magnesium Citrate as directed.   ____ Use CHG Soap or sage wipes as directed on instruction sheet   _X___ Use inhalers on the day of surgery and bring to hospital day of [place]   ____ Stop Metformin and Janumet 2 days prior to surgery.  ____ Take 1/2 of usual insulin dose the night before surgery and none on the morning surgery.   ____ Follow recommendations from Cardiologist, Pulmonologist or PCP regarding stopping Aspirin, Coumadin, Plavix ,Eliquis, Effient, or Pradaxa, and Pletal.  X____Stop Anti-inflammatories such as Advil, Aleve, Ibuprofen, Motrin, Naproxen, MOBIC (MELOXICAM) Naprosyn, Goodies powders or aspirin products 7 DAYS PRIOR TO YOUR SURGERY-OK to take Tylenol   ____ Stop supplements until after surgery.     _X___ Bring C-Pap to the hospital.

## 2019-11-25 NOTE — Pre-Procedure Instructions (Signed)
EKG: Normal sinus rhythm, with PVCs. VR 73, QRS 182, QTc 456. No ST elevations or depressions. No ischemia. ________________________________________  RADIOLOGY All imaging, including plain films, CT scans, and ultrasounds, independently reviewed by me, and interpretations confirmed via formal radiology reads.  ED MD interpretation:   CT Head: NAICA MRI: neg for acute infarct  Official radiology report(s): CT Head Wo Contrast  Result Date: 10/28/2019 CLINICAL DATA:  79 year old female with ataxia. Concern for stroke. EXAM: CT HEAD WITHOUT CONTRAST TECHNIQUE: Contiguous axial images were obtained from the base of the skull through the vertex without intravenous contrast. COMPARISON:  None. FINDINGS: Brain: There is mild age-related atrophy and chronic microvascular ischemic changes. There is no acute intracranial hemorrhage. No mass effect or midline shift. No extra-axial fluid collection. Vascular: No hyperdense vessel or unexpected calcification. Skull: Normal. Negative for fracture or focal lesion. Sinuses/Orbits: No acute finding. Other: None IMPRESSION: 1. No acute intracranial pathology. 2. Mild age-related atrophy and chronic microvascular ischemic changes. Electronically Signed   By: Anner Crete M.D.   On: 10/28/2019 21:25   MR BRAIN WO CONTRAST  Result Date: 10/28/2019 CLINICAL DATA:  79 year old female with dizziness, weakness, ataxia. EXAM: MRI HEAD WITHOUT CONTRAST TECHNIQUE: Multiplanar, multiecho pulse sequences of the brain and surrounding structures were obtained without intravenous contrast. COMPARISON:  Head CT earlier tonight. FINDINGS: Brain: Cerebral volume is within normal limits for age. No restricted diffusion to suggest acute infarction. No midline shift, mass effect, evidence of mass lesion, ventriculomegaly, extra-axial collection or acute intracranial hemorrhage. Cervicomedullary junction and pituitary are within normal limits. Scattered cerebral white matter T2  and FLAIR hyperintensity, especially in the bilateral corona radiata. There is some involvement of the bilateral basal ganglia greater on the left. No superimposed cortical encephalomalacia or chronic cerebral blood products. The thalami, brainstem and cerebellum appear normal. Vascular: Major intracranial vascular flow voids are preserved. The right vertebral artery appears dominant. Skull and upper cervical spine: Negative visible cervical spine. Visualized bone marrow signal is within normal limits. Sinuses/Orbits: Postoperative changes to both globes, otherwise negative orbits. Paranasal Visualized paranasal sinuses and mastoids are stable and well pneumatized. Other: Grossly normal visible internal auditory structures. Normal stylomastoid foramina. Scalp and face soft tissues appear negative. IMPRESSION: 1. No acute intracranial abnormality. 2. 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 Electronically Signed   By: Genevie Ann M.D.   On: 10/28/2019 22:59    ____________________________________________  PROCEDURES   Procedure(s) performed (including Critical Care):  Procedures  ____________________________________________  INITIAL IMPRESSION / MDM / [place] / ED COURSE  As part of my medical decision making, I reviewed the following data within the [place] notes reviewed and incorporated, Old chart reviewed, Notes from prior ED visits, and [place] Controlled Substance Database       *Lisa Cowan was evaluated in Emergency Department on 10/29/2019 for the symptoms described in the history of present illness. She was evaluated in the context of the global COVID-19 pandemic, which necessitated consideration that the patient might be at risk for infection with the SARS-CoV-2 virus that causes COVID-19. Institutional protocols and algorithms that  pertain to the evaluation of patients at risk for COVID-19 are in a state of rapid change based on information released by regulatory bodies including the CDC and federal and state organizations. These policies and algorithms were followed during the patient's care in the ED.  Some  ED evaluations and interventions may be delayed as a result of limited staffing during the pandemic.*   Medical Decision Making:  Very pleasant 79 yo F here with mild dizziness upon standing, sense of loss of balance. On exam, she initially had slight horizontal nystagmus but this has resolved on repeat examinations. No dysmetria. CT head and MRI negative for acute abnormality. Lab work is overall unremarkable and patient feels markedly improved after fluids in the ED.  Unclear if pt's dizziness/lightheadedness represents mild orthostasis 2/2 dehydration given its positional nature, versus a mild peripheral vertigo. No other signs of cerebellar or posterior ischemia and given duration of sx >24 hr at time of negative MRI, doubt CVA. EKG is nonischemic, she has no CP, and no arrhythmia on telemetry - do not suspect cardiac etiology.  Following fluids, pt ambulatory in ED without difficulty and tolerating PO. Will encourage fluids and have her f/u with PCP for ongoing work-up, with trial of low-dose meclizine PRN for possible mild peripheral vertigo. Return precautions given. She will stay with her daughter, who is an Therapist, sports, to monitor her sx. Would also consider starting a low dose aspirin in the event of atypical TIA presentation, until further evaluation with her PCP.  ____________________________________________  FINAL CLINICAL IMPRESSION(S) / ED DIAGNOSES  Final diagnoses:  Dizziness  Vertigo     MEDICATIONS GIVEN DURING THIS VISIT:  Medications  sodium chloride flush (NS) 0.9 % injection 3 mL (has no administration in time range)  sodium chloride 0.9 % bolus 1,000 mL (0 mLs Intravenous Stopped 10/29/19  0016)  meclizine (ANTIVERT) tablet 12.5 mg (12.5 mg Oral Given 10/29/19 0026)        ED Discharge Orders               Ordered     meclizine (ANTIVERT) 12.5 MG tablet  2 times daily PRN     10/29/19 0002            Note:  This document was prepared using Dragon voice recognition software and may include unintentional dictation errors.   Duffy Bruce, MD 10/29/19 0144         Electronically signed by Duffy Bruce, MD at 10/29/2019 1:44 AM   ED on 10/28/2019     Detailed Report    Note shared with patient

## 2019-12-01 ENCOUNTER — Other Ambulatory Visit: Payer: Self-pay

## 2019-12-01 ENCOUNTER — Ambulatory Visit
Admission: RE | Admit: 2019-12-01 | Discharge: 2019-12-01 | Disposition: A | Discharge status: Home / Self Care | Payer: PPO | Source: Ambulatory Visit | Attending: Pulmonary Disease | Admitting: Pulmonary Disease

## 2019-12-01 ENCOUNTER — Other Ambulatory Visit
Admission: RE | Admit: 2019-12-01 | Discharge: 2019-12-01 | Disposition: A | Discharge status: Home / Self Care | Payer: PPO | Source: Ambulatory Visit | Attending: Pulmonary Disease | Admitting: Pulmonary Disease

## 2019-12-01 DIAGNOSIS — Z20822 Contact with and (suspected) exposure to covid-19: Secondary | ICD-10-CM | POA: Diagnosis not present

## 2019-12-01 DIAGNOSIS — D3501 Benign neoplasm of right adrenal gland: Secondary | ICD-10-CM | POA: Insufficient documentation

## 2019-12-01 DIAGNOSIS — Z01818 Encounter for other preprocedural examination: Secondary | ICD-10-CM

## 2019-12-01 DIAGNOSIS — R918 Other nonspecific abnormal finding of lung field: Secondary | ICD-10-CM

## 2019-12-01 LAB — CBC
HCT: 37.4 % (ref 36.0–46.0)
Hemoglobin: 12.6 g/dL (ref 12.0–15.0)
MCH: 28.8 pg (ref 26.0–34.0)
MCHC: 33.7 g/dL (ref 30.0–36.0)
MCV: 85.4 fL (ref 80.0–100.0)
Platelets: 242 10*3/uL (ref 150–400)
RBC: 4.38 MIL/uL (ref 3.87–5.11)
RDW: 13.6 % (ref 11.5–15.5)
WBC: 4.8 10*3/uL (ref 4.0–10.5)
nRBC: 0 % (ref 0.0–0.2)

## 2019-12-01 LAB — PROTIME-INR
INR: 1 (ref 0.8–1.2)
Prothrombin Time: 12.7 seconds (ref 11.4–15.2)

## 2019-12-01 LAB — SARS CORONAVIRUS 2 (TAT 6-24 HRS): SARS Coronavirus 2: NEGATIVE

## 2019-12-01 LAB — APTT: aPTT: 26 seconds (ref 24–36)

## 2019-12-01 LAB — POTASSIUM: Potassium: 3.5 mmol/L (ref 3.5–5.1)

## 2019-12-05 ENCOUNTER — Ambulatory Visit
Admission: RE | Admit: 2019-12-05 | Discharge: 2019-12-05 | Disposition: A | Discharge status: Home / Self Care | Payer: PPO | Source: Ambulatory Visit | Attending: Pulmonary Disease | Admitting: Pulmonary Disease

## 2019-12-05 ENCOUNTER — Encounter
Admission: RE | Disposition: A | Discharge status: Home / Self Care | Payer: Self-pay | Source: Ambulatory Visit | Attending: Pulmonary Disease

## 2019-12-05 ENCOUNTER — Ambulatory Visit: Payer: PPO | Admitting: Certified Registered"

## 2019-12-05 ENCOUNTER — Other Ambulatory Visit: Payer: Self-pay

## 2019-12-05 ENCOUNTER — Ambulatory Visit: Payer: PPO

## 2019-12-05 DIAGNOSIS — Z803 Family history of malignant neoplasm of breast: Secondary | ICD-10-CM | POA: Insufficient documentation

## 2019-12-05 DIAGNOSIS — J449 Chronic obstructive pulmonary disease, unspecified: Secondary | ICD-10-CM | POA: Diagnosis not present

## 2019-12-05 DIAGNOSIS — Z801 Family history of malignant neoplasm of trachea, bronchus and lung: Secondary | ICD-10-CM | POA: Insufficient documentation

## 2019-12-05 DIAGNOSIS — Z8541 Personal history of malignant neoplasm of cervix uteri: Secondary | ICD-10-CM | POA: Insufficient documentation

## 2019-12-05 DIAGNOSIS — G4730 Sleep apnea, unspecified: Secondary | ICD-10-CM | POA: Insufficient documentation

## 2019-12-05 DIAGNOSIS — Z800 Family history of malignant neoplasm of digestive organs: Secondary | ICD-10-CM | POA: Diagnosis not present

## 2019-12-05 DIAGNOSIS — G4733 Obstructive sleep apnea (adult) (pediatric): Secondary | ICD-10-CM | POA: Diagnosis not present

## 2019-12-05 DIAGNOSIS — Z885 Allergy status to narcotic agent status: Secondary | ICD-10-CM | POA: Insufficient documentation

## 2019-12-05 DIAGNOSIS — E785 Hyperlipidemia, unspecified: Secondary | ICD-10-CM | POA: Diagnosis not present

## 2019-12-05 DIAGNOSIS — K219 Gastro-esophageal reflux disease without esophagitis: Secondary | ICD-10-CM | POA: Diagnosis not present

## 2019-12-05 DIAGNOSIS — R918 Other nonspecific abnormal finding of lung field: Secondary | ICD-10-CM | POA: Diagnosis not present

## 2019-12-05 DIAGNOSIS — Z87891 Personal history of nicotine dependence: Secondary | ICD-10-CM | POA: Insufficient documentation

## 2019-12-05 DIAGNOSIS — Z85828 Personal history of other malignant neoplasm of skin: Secondary | ICD-10-CM | POA: Insufficient documentation

## 2019-12-05 DIAGNOSIS — I10 Essential (primary) hypertension: Secondary | ICD-10-CM | POA: Diagnosis not present

## 2019-12-05 DIAGNOSIS — Z808 Family history of malignant neoplasm of other organs or systems: Secondary | ICD-10-CM | POA: Diagnosis not present

## 2019-12-05 DIAGNOSIS — Z888 Allergy status to other drugs, medicaments and biological substances status: Secondary | ICD-10-CM | POA: Insufficient documentation

## 2019-12-05 HISTORY — PX: VIDEO BRONCHOSCOPY WITH ENDOBRONCHIAL ULTRASOUND: SHX6177

## 2019-12-05 HISTORY — PX: VIDEO BRONCHOSCOPY WITH ENDOBRONCHIAL NAVIGATION: SHX6175

## 2019-12-05 SURGERY — BRONCHOSCOPY, WITH EBUS
Anesthesia: General

## 2019-12-05 MED ORDER — ACETAMINOPHEN 10 MG/ML IV SOLN: 1000.0000 mg | Freq: Once | INTRAVENOUS | Status: DC | PRN

## 2019-12-05 MED ORDER — ROCURONIUM BROMIDE 100 MG/10ML IV SOLN
INTRAVENOUS | Status: DC | PRN
  Administered 2019-12-05: 50 mg via INTRAVENOUS

## 2019-12-05 MED ORDER — BUTAMBEN-TETRACAINE-BENZOCAINE 2-2-14 % EX AERO
1.0000 | INHALATION_SPRAY | Freq: Once | CUTANEOUS | Status: DC
  Filled 2019-12-05: qty 20

## 2019-12-05 MED ORDER — ONDANSETRON HCL 4 MG/2ML IJ SOLN
INTRAMUSCULAR | Status: AC
  Filled 2019-12-05: qty 2

## 2019-12-05 MED ORDER — LIDOCAINE HCL URETHRAL/MUCOSAL 2 % EX GEL
1.0000 "application " | Freq: Once | CUTANEOUS | Status: DC
  Filled 2019-12-05: qty 5

## 2019-12-05 MED ORDER — PROPOFOL 10 MG/ML IV BOLUS
INTRAVENOUS | Status: AC
  Filled 2019-12-05: qty 20

## 2019-12-05 MED ORDER — FENTANYL CITRATE (PF) 100 MCG/2ML IJ SOLN: 25.0000 ug | INTRAMUSCULAR | Status: DC | PRN

## 2019-12-05 MED ORDER — SUGAMMADEX SODIUM 200 MG/2ML IV SOLN
INTRAVENOUS | Status: DC | PRN
  Administered 2019-12-05: 170.8 mg via INTRAVENOUS

## 2019-12-05 MED ORDER — FENTANYL CITRATE (PF) 100 MCG/2ML IJ SOLN
INTRAMUSCULAR | Status: AC
  Filled 2019-12-05: qty 2

## 2019-12-05 MED ORDER — ROCURONIUM BROMIDE 10 MG/ML (PF) SYRINGE
PREFILLED_SYRINGE | INTRAVENOUS | Status: AC
  Filled 2019-12-05: qty 10

## 2019-12-05 MED ORDER — FENTANYL CITRATE (PF) 100 MCG/2ML IJ SOLN
INTRAMUSCULAR | Status: DC | PRN
  Administered 2019-12-05: 25 ug via INTRAVENOUS
  Administered 2019-12-05: 50 ug via INTRAVENOUS
  Administered 2019-12-05: 25 ug via INTRAVENOUS

## 2019-12-05 MED ORDER — DEXAMETHASONE SODIUM PHOSPHATE 10 MG/ML IJ SOLN
INTRAMUSCULAR | Status: AC
  Filled 2019-12-05: qty 1

## 2019-12-05 MED ORDER — PROPOFOL 10 MG/ML IV BOLUS
INTRAVENOUS | Status: DC | PRN
  Administered 2019-12-05: 130 mg via INTRAVENOUS

## 2019-12-05 MED ORDER — LIDOCAINE HCL (PF) 1 % IJ SOLN
30.0000 mL | Freq: Once | INTRAMUSCULAR | Status: DC
  Filled 2019-12-05: qty 30

## 2019-12-05 MED ORDER — DEXAMETHASONE SODIUM PHOSPHATE 10 MG/ML IJ SOLN
INTRAMUSCULAR | Status: DC | PRN
  Administered 2019-12-05: 5 mg via INTRAVENOUS

## 2019-12-05 MED ORDER — ARTIFICIAL TEARS OPHTHALMIC OINT
TOPICAL_OINTMENT | OPHTHALMIC | Status: AC
  Filled 2019-12-05: qty 3.5

## 2019-12-05 MED ORDER — ONDANSETRON HCL 4 MG/2ML IJ SOLN
INTRAMUSCULAR | Status: DC | PRN
  Administered 2019-12-05: 4 mg via INTRAVENOUS

## 2019-12-05 MED ORDER — LIDOCAINE HCL (CARDIAC) PF 100 MG/5ML IV SOSY
PREFILLED_SYRINGE | INTRAVENOUS | Status: DC | PRN
  Administered 2019-12-05: 80 mg via INTRAVENOUS

## 2019-12-05 MED ORDER — ONDANSETRON HCL 4 MG/2ML IJ SOLN: 4.0000 mg | Freq: Once | INTRAMUSCULAR | Status: DC | PRN

## 2019-12-05 MED ORDER — PHENYLEPHRINE HCL 0.25 % NA SOLN
1.0000 | Freq: Four times a day (QID) | NASAL | Status: DC | PRN
  Filled 2019-12-05: qty 15

## 2019-12-05 MED ORDER — LACTATED RINGERS IV SOLN: INTRAVENOUS | Status: DC

## 2019-12-05 MED ORDER — LIDOCAINE HCL (PF) 2 % IJ SOLN
INTRAMUSCULAR | Status: AC
  Filled 2019-12-05: qty 10

## 2019-12-05 MED ORDER — PHENYLEPHRINE HCL (PRESSORS) 10 MG/ML IV SOLN
INTRAVENOUS | Status: DC | PRN
  Administered 2019-12-05: 200 ug via INTRAVENOUS

## 2019-12-05 NOTE — H&P (Signed)
Pulmonary Medicine          Date: 12/05/2019,   MRN# [id number] IRVIN LIZAMA Aug 20, 1940     AdmissionWeight: 85.4 kg                 CurrentWeight: 85.4 kg      CHIEF COMPLAINT:   Pulmonary nodules   HISTORY OF PRESENT ILLNESS   Patient is a 79 yo F with hx of COPD smoker for >77yrs in the past. She has hx of cancer in family including sister with pancratic Cancer, sister with lung cancer, brother with lung cancer, another brother with colon cancer, cousin with brain cancer, another cousin with breast cancer.  She has a pesonal history of skin and cervical cancer, she  had CT chest done with outpatient primary pulmonologist and reviewed 2 pulm nodules , 1cm and 20mm.  These findings were discussed with patient and she wishes to have full workup to investigate malignant potential. Plan today is to perform ENB to obtain tissue diagnosis of pulmonary nodules.    PAST MEDICAL HISTORY   Past Medical History:  Diagnosis Date  . Adrenal nodule ([place])   . Anemia   . Anginal pain ([place])   . Arthritis   . Cancer ([place])    cervical ca  . Complication of anesthesia   . COPD (chronic obstructive pulmonary disease) ([place])   . Depression   . Dyspnea    with exertion  . GERD (gastroesophageal reflux disease)   . Headache    h/o migraines  . Hyperlipidemia   . Hypertension   . PONV (postoperative nausea and vomiting)    x1 and no problems since  . Skin cancer   . Sleep apnea    CPAP     SURGICAL HISTORY   Past Surgical History:  Procedure Laterality Date  . ABDOMINAL HYSTERECTOMY    . ADENOIDECTOMY    . BREAST BIOPSY Right 01/04/2016   stereo  . CARDIAC CATHETERIZATION Left 08/21/2016   Procedure: Left Heart Cath and Coronary Angiography;  Surgeon: Isaias Cowman, MD;  Location: [place] [facility];  Service: Cardiovascular;  Laterality: Left;  . CATARACT EXTRACTION W/PHACO Right 05/18/2019   Procedure: CATARACT EXTRACTION PHACO AND INTRAOCULAR LENS  PLACEMENT (IOC) Right  00:52.2  18.7%  9.59;  Surgeon: Leandrew Koyanagi, MD;  Location: [place];  Service: Ophthalmology;  Laterality: Right;  sleep apnea  . CATARACT EXTRACTION W/PHACO Left 06/08/2019   Procedure: CATARACT EXTRACTION PHACO AND INTRAOCULAR LENS PLACEMENT (IOC) LEFT 9.60,    01:08.5,    14.0%;  Surgeon: Leandrew Koyanagi, MD;  Location: [place];  Service: Ophthalmology;  Laterality: Left;  . COLONOSCOPY WITH PROPOFOL N/A 08/07/2017   Procedure: COLONOSCOPY WITH PROPOFOL;  Surgeon: Manya Silvas, MD;  Location[facility] ENDOSCOPY;  Service: Endoscopy;  Laterality: N/A;  . ESOPHAGOGASTRODUODENOSCOPY (EGD) WITH PROPOFOL N/A 08/07/2017   Procedure: ESOPHAGOGASTRODUODENOSCOPY (EGD) WITH PROPOFOL;  Surgeon: Manya Silvas, MD;  Location: [facility] ENDOSCOPY;  Service: Endoscopy;  Laterality: N/A;  . FOOT SURGERY     multiple  . FRACTURE SURGERY     great toe fx  . ROTATOR CUFF REPAIR Left   . TONSILLECTOMY       FAMILY HISTORY   Family History  Problem Relation Age of Onset  . Breast cancer Sister 73     SOCIAL HISTORY   Social History   Tobacco Use  . Smoking status: Former Smoker    Packs/day: 1.00    Years:  30.00    Pack years: 30.00    Types: Cigarettes    Quit date: 2000    Years since quitting: 21.3  . Smokeless tobacco: Never Used  Substance Use Topics  . Alcohol use: Not Currently    Comment: occassionally  . Drug use: No     MEDICATIONS    Home Medication:    Current Medication:  Current Facility-Administered Medications:  .  butamben-tetracaine-benzocaine (CETACAINE) spray 1 spray, 1 spray, Topical, Once, Burt Piatek, MD .  lactated ringers infusion, , Intravenous, Continuous, [place], [place]se [place], MD .  lidocaine (PF) (XYLOCAINE) 1 % injection 30 mL, 30 mL, Other, Once, Sarabelle Genson, MD .  lidocaine (XYLOCAINE) 2 % jelly 1 application, 1 application, Topical, Once, Keviana Guida, MD .  phenylephrine  (NEO-SYNEPHRINE) 0.25 % nasal spray 1 spray, 1 spray, Each Nare, Q6H PRN, Ottie Glazier, MD    ALLERGIES   Codeine, Hydrocodone-acetaminophen, Nucynta [tapentadol], Oxycodone-acetaminophen, Statins, and Adhesive [tape]     REVIEW OF SYSTEMS    Review of Systems:  Gen:  Denies  fever, sweats, chills weigh loss  HEENT: Denies blurred vision, double vision, ear pain, eye pain, hearing loss, nose bleeds, sore throat Cardiac:  No dizziness, chest pain or heaviness, chest tightness,edema Resp:   Denies cough or sputum porduction, shortness of breath,wheezing, hemoptysis,  Gi: Denies swallowing difficulty, stomach pain, nausea or vomiting, diarrhea, constipation, bowel incontinence Gu:  Denies bladder incontinence, burning urine Ext:   Denies Joint pain, stiffness or swelling Skin: Denies  skin rash, easy bruising or bleeding or hives Endoc:  Denies polyuria, polydipsia , polyphagia or weight change Psych:   Denies depression, insomnia or hallucinations   Other:  All other systems negative   VS: BP (!) 90/52   Pulse 72   Temp 97.8 F (36.6 C) (Oral)   Resp 18   Ht 4\' 11"  (1.499 m)   Wt 85.4 kg   SpO2 98%   BMI 38.03 kg/m      PHYSICAL EXAM    GENERAL:NAD, no fevers, chills, no weakness no fatigue HEAD: Normocephalic, atraumatic.  EYES: Pupils equal, round, reactive to light. Extraocular muscles intact. No scleral icterus.  MOUTH: Moist mucosal membrane. Dentition intact. No abscess noted.  EAR, NOSE, THROAT: Clear without exudates. No external lesions.  NECK: Supple. No thyromegaly. No nodules. No JVD.  PULMONARY: Diffuse coarse rhonchi right sided +wheezes CARDIOVASCULAR: S1 and S2. Regular rate and rhythm. No murmurs, rubs, or gallops. No edema. Pedal pulses 2+ bilaterally.  GASTROINTESTINAL: Soft, nontender, nondistended. No masses. Positive bowel sounds. No hepatosplenomegaly.  MUSCULOSKELETAL: No swelling, clubbing, or edema. Range of motion full in all  extremities.  NEUROLOGIC: Cranial nerves II through XII are intact. No gross focal neurological deficits. Sensation intact. Reflexes intact.  SKIN: No ulceration, lesions, rashes, or cyanosis. Skin warm and dry. Turgor intact.  PSYCHIATRIC: Mood, affect within normal limits. The patient is awake, alert and oriented x 3. Insight, judgment intact.       IMAGING    CT Super D Chest Wo Contrast  Result Date: 12/02/2019 CLINICAL DATA:  Lung nodule EXAM: CT CHEST WITHOUT CONTRAST TECHNIQUE: Multidetector CT imaging of the chest was performed using thin slice collimation for electromagnetic bronchoscopy planning purposes, without intravenous contrast. COMPARISON:  PET-CT 11/03/2019, CT 09/19/2019 FINDINGS: Cardiovascular: Coronary artery calcification and aortic atherosclerotic calcification. Mediastinum/Nodes: No axillary supraclavicular adenopathy. No mediastinal hilar adenopathy no pericardial effusion. Esophagus. Lungs/Pleura: Again demonstrated irregular peripheral nodule in the RIGHT upper lobe measuring 10 mm (image  29/3) not changed from prior. Second nodule in the inferior RIGHT upper lobe measuring 9 mm (image 70/3) also unchanged. Third nodule in the RIGHT middle lobe measuring 5 mm (image 84) unchanged. No new pulmonary nodularity. Upper Abdomen: Limited view of the liver, kidneys, pancreas are unremarkable. RIGHT adrenal adenoma. Musculoskeletal: No acute osseous abnormality. IMPRESSION: 1. No significant change in three RIGHT lung pulmonary nodules compared to CT 11/19/2019. 2. RIGHT adrenal adenoma. Electronically Signed   By: Suzy Bouchard M.D.   On: 12/02/2019 10:50      ASSESSMENT/PLAN   Pulmonary nodules - RUL 46mm RML 42mm  - patient with moderate pre-test probability of lung cancer due to personal, family hx of cancer and extensive smoking history   -will perform ENB/EBUS if necessary -Discussed risk/benefit ration for procedure.   Complications include: bleeding, cough,  infection, pneumothorax, pneumomediastinum, possible need for chest tube and intubation with mechanical ventilation and rarely death.  Patient relates understanding above and all additional questions have been answered.  -patient may have cough with blood clots post procedure which is expected.      Thank you for allowing me to participate in the care of this patient.   Patient/Family are satisfied with care plan and all questions have been answered.  This document was prepared using Dragon voice recognition software and may include unintentional dictation errors.     Ottie Glazier, M.D.  Division of [place]

## 2019-12-05 NOTE — Anesthesia Preprocedure Evaluation (Signed)
Anesthesia Evaluation  Patient identified by MRN, date of birth, ID band Patient awake  General Assessment Comment:Only had one  Episode of vomiting after one anesthetic  Reviewed: Allergy & Precautions, H&P , NPO status , Patient's Chart, lab work & pertinent test results, reviewed documented beta blocker date and time   History of Anesthesia Complications (+) PONV and history of anesthetic complications  Airway Mallampati: II  TM Distance: >3 FB Neck ROM: full    Dental no notable dental hx. (+) Teeth Intact, Dental Advisory Given   Pulmonary shortness of breath, sleep apnea and Continuous Positive Airway Pressure Ventilation , COPD,  COPD inhaler, former smoker,    Pulmonary exam normal breath sounds clear to auscultation       Cardiovascular Exercise Tolerance: Good hypertension, Normal cardiovascular exam Rhythm:regular Rate:Normal     Neuro/Psych negative neurological ROS  negative psych ROS   GI/Hepatic Neg liver ROS, GERD  Controlled,  Endo/Other  negative endocrine ROS  Renal/GU negative Renal ROS  negative genitourinary   Musculoskeletal  (+) Arthritis ,   Abdominal   Peds  Hematology negative hematology ROS (+)   Anesthesia Other Findings   Reproductive/Obstetrics negative OB ROS                             Anesthesia Physical  Anesthesia Plan  ASA: III  Anesthesia Plan: General   Post-op Pain Management:    Induction: Intravenous  PONV Risk Score and Plan: 4 or greater and Ondansetron and Dexamethasone  Airway Management Planned: Oral ETT  Additional Equipment: None  Intra-op Plan:   Post-operative Plan: Extubation in OR  Informed Consent: I have reviewed the patients History and Physical, chart, labs and discussed the procedure including the risks, benefits and alternatives for the proposed anesthesia with the patient or authorized representative who has  indicated his/her understanding and acceptance.     Dental Advisory Given  Plan Discussed with: CRNA  Anesthesia Plan Comments: (Discussed risks of anesthesia with patient, including PONV, sore throat, lip/dental damage. Rare risks discussed as well, such as cardiorespiratory and neurological sequelae. Patient understands.)        Anesthesia Quick Evaluation

## 2019-12-05 NOTE — Anesthesia Procedure Notes (Signed)
Procedure Name: Intubation Date/Time: 12/05/2019 1:16 PM Performed by: Jerrye Noble, CRNA Pre-anesthesia Checklist: Patient identified, Emergency Drugs available, Suction available and Patient being monitored Patient Re-evaluated:Patient Re-evaluated prior to induction Oxygen Delivery Method: Circle system utilized Preoxygenation: Pre-oxygenation with 100% oxygen Induction Type: IV induction Ventilation: Mask ventilation without difficulty Laryngoscope Size: McGraph and 3 Grade View: Grade I Tube type: Oral Tube size: 9.0 mm Number of attempts: 1 Airway Equipment and Method: Stylet,  Oral airway and LTA kit utilized Placement Confirmation: ETT inserted through vocal cords under direct vision,  positive ETCO2 and breath sounds checked- equal and bilateral Secured at: 23 cm Tube secured with: Tape Dental Injury: Injury to lip

## 2019-12-05 NOTE — Procedures (Signed)
ELECTROMAGNETIC NAVIGATIONAL BRONCHOSCOPY PROCEDURE NOTE  FIBEROPTIC BRONCHOSCOPY WITH BRONCHOALVEOLAR LAVAGE PROCEDURE NOTE  ENDOBRONCHIAL ULTRASOUND PROCEDURE NOTE    Flexible bronchoscopy was performed  by : Lanney Gins MD  assistance by : 1)Repiratory therapist  and 2)[facility] staff and 3) Anesthesia team and 4) Flouroscopy team    Indication for the procedure was :  Pre-procedural H&P. The following assessment was performed on the day of the procedure prior to initiating sedation History:  Chest pain n Dyspnea y Hemoptysis n Cough y Fever n Other pertinent items n  Examination Vital signs -reviewed as per nursing documentation today Cardiac    Murmurs: n  Rubs : n  Gallop: n Lungs Wheezing: n Rales : n Rhonchi :y  Other pertinent findings: SOB/hypoxemia due to chronic lung disease   Pre-procedural assessment for Procedural Sedation included: Depth of sedation: As per anesthesia team  ASA Classification:  2 Mallampati airway assessment: 3    Medication list reviewed: y  The patient's interval history was taken and revealed: no new complaints The pre- procedure physical examination revealed: No new findings Refer to prior clinic note for details.  Informed Consent: Informed consent was obtained from:  patient after explanation of procedure and risks, benefits, as well as alternative procedures available.  Explanation of level of sedation and possible transfusion was also provided.    Procedural Preparation: Time out was performed and patient was identified by name and birthdate and procedure to be performed and side for sampling, if any, was specified. Pt was intubated by anesthesia.  The patient was appropriately draped.   Fiberoptic bronchoscopy with airway inspection and BAL Procedure findings:  Bronchoscope was inserted via ETT  without difficulty.  Posterior oropharynx, epiglottis, arytenoids, false cords and vocal cords were not visualized  as these were bypassed by endotracheal tube. The distal trachea was normal in circumference and appearance without mucosal, cartilaginous or branching abnormalities.  The main carina was mildly splayed . All right and left lobar airways were visualized to the Subsegmental level.  Sub- sub segmental carinae were identified in all the distal airways.   Secretions were visible in the following airways and appeared to be clear.  The mucosa was : friable at medial segment on RML  Airways were notable for:        exophytic lesions :n       extrinsic compression in the following distributions: n.       Friable mucosa: y       Neurosurgeon /pigmentation: n     Post procedure Diagnosis:   phelgm bilaterally which was thick but was able to be evacuated with suctioning, otherwise normal airway inspection     Electromagnetic Navigational Bronchoscopy Procedure Findings:  After appropriate CT-guided planning ENB scope was advanced via endotracheal tube and LG was advanced for registration.  Post appropriate planning and registration peripheral navigation was used to visualize target lesion.  Target lesion at RML was initially biopsied with cytobrush X2, this was followed by surgical forceps biopsy X5.  Cytotechs reporting rare atypical cells. There was bleeding post biopsy which required 1 dose of epi at biopsy site and then resolved.   Post procedure diagnosis: atypical cells +      Endobronchial ultrasound assisted hilar and mediastinal lymph node biopsies procedure findings: The fiberoptic bronchoscope was removed and the EBUS scope was introduced. Examination began to evaluate for pathologically enlarged lymph nodes starting on the Left side progressing to the right side.    Initially station for L was  evaluated and found to be 5 mm under direct ultrasound visualization and was not biopsied, next station 10 L was measured under ultrasound and was found to be 6 mm and was not biopsied,  next station 7 was measured under ultrasound was approximately 7 mm and was not biopsied, next station 10 R was measured under ultrasound which was 6 mm and was not biopsied, last station 4R was measured under ultrasound guidance to be approximately 5 mm and was not biopsied.    Post procedure diagnosis: Normal lymph node size and shape  Specimens obtained included:             Cytology brushes : RML  Broncho-alveolar lavage site:RML  sent for cytology                             60 ml volume infused 35 ml volume returned with blood and clots appearance   Immediate sampling complications included:bleeding resolved with 1 dose of epi Epinephrine 2 ml was used topically one time during procedure  The bronchoscopy was terminated due to completion of the planned procedure and the bronchoscope was removed.   Total dosage of Lidocaine was 89m Total fluoroscopy time was 3 minutes   Estimated Blood loss: 20cc.  Complications included:  bleeding which resolved post one dose epi   Disposition: home with family  Follow up with Dr. ALanney Ginsin 5 days for result discussion.     FOttie GlazierMD  KBaneberryDivision of Pulmonary & Critical Care Medicine

## 2019-12-05 NOTE — Transfer of Care (Signed)
Immediate Anesthesia Transfer of Care Note  Patient: Lisa Cowan  Procedure(s) Performed: VIDEO BRONCHOSCOPY WITH ENDOBRONCHIAL ULTRASOUND (N/A ) VIDEO BRONCHOSCOPY WITH ENDOBRONCHIAL NAVIGATION (N/A )  Patient Location: PACU  Anesthesia Type:General  Level of Consciousness: awake, alert  and oriented  Airway & Oxygen Therapy: Patient Spontanous Breathing and Patient connected to face mask oxygen  Post-op Assessment: Report given to RN and Post -op Vital signs reviewed and stable  Post vital signs: Reviewed and stable  Last Vitals:  Vitals Value Taken Time  BP 126/64 12/05/19 1446  Temp 35.6 C 12/05/19 1445  Pulse 75 12/05/19 1452  Resp 16 12/05/19 1452  SpO2 92 % 12/05/19 1452  Vitals shown include unvalidated device data.  Last Pain:  Vitals:   12/05/19 1445  TempSrc:   PainSc: 0-No pain         Complications: No apparent anesthesia complications

## 2019-12-05 NOTE — Discharge Instructions (Addendum)
Flexible Bronchoscopy, Care After This sheet gives you information about how to care for yourself after your test. Your doctor may also give you more specific instructions. If you have problems or questions, contact your doctor. Follow these instructions at home: Eating and drinking  Do not eat or drink anything (not even water) for 2 hours after your test, or until your numbing medicine (local anesthetic) wears off.  When your numbness is gone and your cough and gag reflexes have come back, you may: ? Eat only soft foods. ? Slowly drink liquids.  The day after the test, go back to your normal diet. Driving  Do not drive for 24 hours if you were given a medicine to help you relax (sedative).  Do not drive or use heavy machinery while taking prescription pain medicine. General instructions   Take over-the-counter and prescription medicines only as told by your doctor.  Return to your normal activities as told. Ask what activities are safe for you.  Do not use any products that have nicotine or tobacco in them. This includes cigarettes and e-cigarettes. If you need help quitting, ask your doctor.  Keep all follow-up visits as told by your doctor. This is important. It is very important if you had a tissue sample (biopsy) taken. Get help right away if:  You have shortness of breath that gets worse.  You get light-headed.  You feel like you are going to pass out (faint).  You have chest pain.  You cough up: ? More than a little blood. ? More blood than before. Summary  Do not eat or drink anything (not even water) for 2 hours after your test, or until your numbing medicine wears off.  Do not use cigarettes. Do not use e-cigarettes.  Get help right away if you have chest pain. This information is not intended to replace advice given to you by your health care provider. Make sure you discuss any questions you have with your health care provider. Document Revised: 06/19/2017  Document Reviewed: 07/25/2016 Elsevier Patient Education  2020 [place]   1) The drugs that you were given will stay in your system until tomorrow so for the next 24 hours you should not:  A) Drive an automobile B) Make any legal decisions C) Drink any alcoholic beverage   2) You may resume regular meals tomorrow.  Today it is better to start with liquids and gradually work up to solid foods.  You may eat anything you prefer, but it is better to start with liquids, then soup and crackers, and gradually work up to solid foods.   3) Please notify your doctor immediately if you have any unusual bleeding, trouble breathing, redness and pain at the surgery site, drainage, fever, or pain not relieved by medication.    4) Additional Instructions:        Please contact your physician with any problems or Same Day Surgery at [phone], Monday through Friday 6 am to 4 pm, or [place] at [facility] number at [phone].

## 2019-12-06 LAB — SURGICAL PATHOLOGY

## 2019-12-06 LAB — CYTOLOGY - NON PAP

## 2019-12-06 NOTE — Anesthesia Postprocedure Evaluation (Signed)
Anesthesia Post Note  Patient: Lisa Cowan  Procedure(s) Performed: VIDEO BRONCHOSCOPY WITH ENDOBRONCHIAL ULTRASOUND (N/A ) VIDEO BRONCHOSCOPY WITH ENDOBRONCHIAL NAVIGATION (N/A )  Patient location during evaluation: PACU Anesthesia Type: General Level of consciousness: awake and alert Pain management: pain level controlled Vital Signs Assessment: post-procedure vital signs reviewed and stable Respiratory status: spontaneous breathing, nonlabored ventilation, respiratory function stable and patient connected to nasal cannula oxygen Cardiovascular status: blood pressure returned to baseline and stable Postop Assessment: no apparent nausea or vomiting Anesthetic complications: no     Last Vitals:  Vitals:   12/05/19 1545 12/05/19 1607  BP: 120/76 130/69  Pulse: 73 76  Resp: (!) 21 19  Temp: (!) 35.9 C (!) 36.1 C  SpO2: 95% 93%    Last Pain:  Vitals:   12/05/19 1607  TempSrc: Temporal  PainSc: 0-No pain                 Molli Barrows

## 2019-12-13 DIAGNOSIS — R042 Hemoptysis: Secondary | ICD-10-CM | POA: Diagnosis not present

## 2019-12-13 DIAGNOSIS — R918 Other nonspecific abnormal finding of lung field: Secondary | ICD-10-CM | POA: Diagnosis not present

## 2019-12-13 DIAGNOSIS — R0600 Dyspnea, unspecified: Secondary | ICD-10-CM | POA: Diagnosis not present

## 2019-12-13 DIAGNOSIS — J439 Emphysema, unspecified: Secondary | ICD-10-CM | POA: Diagnosis not present

## 2019-12-13 DIAGNOSIS — G4733 Obstructive sleep apnea (adult) (pediatric): Secondary | ICD-10-CM | POA: Diagnosis not present

## 2019-12-28 ENCOUNTER — Other Ambulatory Visit: Payer: Self-pay | Admitting: Otolaryngology

## 2019-12-28 DIAGNOSIS — R042 Hemoptysis: Secondary | ICD-10-CM | POA: Diagnosis not present

## 2019-12-28 DIAGNOSIS — R911 Solitary pulmonary nodule: Secondary | ICD-10-CM | POA: Diagnosis not present

## 2019-12-28 DIAGNOSIS — R05 Cough: Secondary | ICD-10-CM | POA: Diagnosis not present

## 2019-12-28 DIAGNOSIS — G4733 Obstructive sleep apnea (adult) (pediatric): Secondary | ICD-10-CM | POA: Diagnosis not present

## 2019-12-28 DIAGNOSIS — R42 Dizziness and giddiness: Secondary | ICD-10-CM | POA: Diagnosis not present

## 2019-12-30 ENCOUNTER — Other Ambulatory Visit: Payer: Self-pay

## 2019-12-30 ENCOUNTER — Ambulatory Visit
Admission: RE | Admit: 2019-12-30 | Discharge: 2019-12-30 | Disposition: A | Discharge status: Home / Self Care | Payer: PPO | Source: Ambulatory Visit | Attending: Otolaryngology | Admitting: Otolaryngology

## 2019-12-30 DIAGNOSIS — R42 Dizziness and giddiness: Secondary | ICD-10-CM | POA: Diagnosis not present

## 2019-12-30 DIAGNOSIS — I6523 Occlusion and stenosis of bilateral carotid arteries: Secondary | ICD-10-CM | POA: Diagnosis not present

## 2020-02-02 DIAGNOSIS — Z1231 Encounter for screening mammogram for malignant neoplasm of breast: Secondary | ICD-10-CM | POA: Diagnosis not present

## 2020-02-02 DIAGNOSIS — G4733 Obstructive sleep apnea (adult) (pediatric): Secondary | ICD-10-CM | POA: Diagnosis not present

## 2020-02-02 DIAGNOSIS — K219 Gastro-esophageal reflux disease without esophagitis: Secondary | ICD-10-CM | POA: Diagnosis not present

## 2020-02-02 DIAGNOSIS — Z0000 Encounter for general adult medical examination without abnormal findings: Secondary | ICD-10-CM | POA: Diagnosis not present

## 2020-02-02 DIAGNOSIS — M5137 Other intervertebral disc degeneration, lumbosacral region: Secondary | ICD-10-CM | POA: Diagnosis not present

## 2020-02-02 DIAGNOSIS — I10 Essential (primary) hypertension: Secondary | ICD-10-CM | POA: Diagnosis not present

## 2020-02-02 DIAGNOSIS — E278 Other specified disorders of adrenal gland: Secondary | ICD-10-CM | POA: Diagnosis not present

## 2020-02-02 DIAGNOSIS — R7309 Other abnormal glucose: Secondary | ICD-10-CM | POA: Diagnosis not present

## 2020-02-02 DIAGNOSIS — F3340 Major depressive disorder, recurrent, in remission, unspecified: Secondary | ICD-10-CM | POA: Diagnosis not present

## 2020-02-03 DIAGNOSIS — Z961 Presence of intraocular lens: Secondary | ICD-10-CM | POA: Diagnosis not present

## 2020-02-09 DIAGNOSIS — F3340 Major depressive disorder, recurrent, in remission, unspecified: Secondary | ICD-10-CM | POA: Diagnosis not present

## 2020-02-09 DIAGNOSIS — I10 Essential (primary) hypertension: Secondary | ICD-10-CM | POA: Diagnosis not present

## 2020-02-09 DIAGNOSIS — J439 Emphysema, unspecified: Secondary | ICD-10-CM | POA: Diagnosis not present

## 2020-02-09 DIAGNOSIS — R21 Rash and other nonspecific skin eruption: Secondary | ICD-10-CM | POA: Diagnosis not present

## 2020-02-09 DIAGNOSIS — R739 Hyperglycemia, unspecified: Secondary | ICD-10-CM | POA: Diagnosis not present

## 2020-02-09 DIAGNOSIS — K219 Gastro-esophageal reflux disease without esophagitis: Secondary | ICD-10-CM | POA: Diagnosis not present

## 2020-02-22 DIAGNOSIS — G4733 Obstructive sleep apnea (adult) (pediatric): Secondary | ICD-10-CM | POA: Diagnosis not present

## 2020-02-23 DIAGNOSIS — D225 Melanocytic nevi of trunk: Secondary | ICD-10-CM | POA: Diagnosis not present

## 2020-02-23 DIAGNOSIS — D2261 Melanocytic nevi of right upper limb, including shoulder: Secondary | ICD-10-CM | POA: Diagnosis not present

## 2020-02-23 DIAGNOSIS — Z85828 Personal history of other malignant neoplasm of skin: Secondary | ICD-10-CM | POA: Diagnosis not present

## 2020-02-23 DIAGNOSIS — D2272 Melanocytic nevi of left lower limb, including hip: Secondary | ICD-10-CM | POA: Diagnosis not present

## 2020-02-23 DIAGNOSIS — L538 Other specified erythematous conditions: Secondary | ICD-10-CM | POA: Diagnosis not present

## 2020-02-23 DIAGNOSIS — D2262 Melanocytic nevi of left upper limb, including shoulder: Secondary | ICD-10-CM | POA: Diagnosis not present

## 2020-02-23 DIAGNOSIS — L820 Inflamed seborrheic keratosis: Secondary | ICD-10-CM | POA: Diagnosis not present

## 2020-02-23 DIAGNOSIS — R208 Other disturbances of skin sensation: Secondary | ICD-10-CM | POA: Diagnosis not present

## 2020-03-12 ENCOUNTER — Other Ambulatory Visit: Payer: Self-pay | Admitting: Specialist

## 2020-03-12 DIAGNOSIS — R911 Solitary pulmonary nodule: Secondary | ICD-10-CM

## 2020-03-14 DIAGNOSIS — I10 Essential (primary) hypertension: Secondary | ICD-10-CM | POA: Diagnosis not present

## 2020-03-14 DIAGNOSIS — R0602 Shortness of breath: Secondary | ICD-10-CM | POA: Diagnosis not present

## 2020-03-14 DIAGNOSIS — G4733 Obstructive sleep apnea (adult) (pediatric): Secondary | ICD-10-CM | POA: Diagnosis not present

## 2020-03-14 DIAGNOSIS — J439 Emphysema, unspecified: Secondary | ICD-10-CM | POA: Diagnosis not present

## 2020-03-14 DIAGNOSIS — R079 Chest pain, unspecified: Secondary | ICD-10-CM | POA: Diagnosis not present

## 2020-04-21 ENCOUNTER — Other Ambulatory Visit: Payer: Self-pay

## 2020-04-21 ENCOUNTER — Ambulatory Visit
Admission: EM | Admit: 2020-04-21 | Discharge: 2020-04-21 | Disposition: A | Discharge status: Home / Self Care | Payer: PPO | Attending: Internal Medicine | Admitting: Internal Medicine

## 2020-04-21 DIAGNOSIS — I10 Essential (primary) hypertension: Secondary | ICD-10-CM | POA: Insufficient documentation

## 2020-04-21 DIAGNOSIS — R0982 Postnasal drip: Secondary | ICD-10-CM | POA: Diagnosis not present

## 2020-04-21 DIAGNOSIS — Z885 Allergy status to narcotic agent status: Secondary | ICD-10-CM | POA: Insufficient documentation

## 2020-04-21 DIAGNOSIS — J449 Chronic obstructive pulmonary disease, unspecified: Secondary | ICD-10-CM | POA: Insufficient documentation

## 2020-04-21 DIAGNOSIS — G4733 Obstructive sleep apnea (adult) (pediatric): Secondary | ICD-10-CM | POA: Diagnosis not present

## 2020-04-21 DIAGNOSIS — Z791 Long term (current) use of non-steroidal anti-inflammatories (NSAID): Secondary | ICD-10-CM | POA: Insufficient documentation

## 2020-04-21 DIAGNOSIS — Z87891 Personal history of nicotine dependence: Secondary | ICD-10-CM | POA: Insufficient documentation

## 2020-04-21 DIAGNOSIS — J029 Acute pharyngitis, unspecified: Secondary | ICD-10-CM | POA: Insufficient documentation

## 2020-04-21 DIAGNOSIS — Z20822 Contact with and (suspected) exposure to covid-19: Secondary | ICD-10-CM | POA: Diagnosis not present

## 2020-04-21 DIAGNOSIS — K219 Gastro-esophageal reflux disease without esophagitis: Secondary | ICD-10-CM | POA: Insufficient documentation

## 2020-04-21 DIAGNOSIS — Z79899 Other long term (current) drug therapy: Secondary | ICD-10-CM | POA: Diagnosis not present

## 2020-04-21 DIAGNOSIS — Z888 Allergy status to other drugs, medicaments and biological substances status: Secondary | ICD-10-CM | POA: Diagnosis not present

## 2020-04-21 DIAGNOSIS — E785 Hyperlipidemia, unspecified: Secondary | ICD-10-CM | POA: Insufficient documentation

## 2020-04-21 DIAGNOSIS — F329 Major depressive disorder, single episode, unspecified: Secondary | ICD-10-CM | POA: Diagnosis not present

## 2020-04-21 LAB — GROUP A STREP BY PCR: Group A Strep by PCR: NOT DETECTED

## 2020-04-21 NOTE — Discharge Instructions (Addendum)
Due to your chronic conditions and if your covid test is positive, you qualify to receive Monoclonal Antibody infusion and if you don't hear from anyone within 24 hours of finding your results, call [phone] If your Covid test ends up positive you may take the following supplements to help your immune system be stronger to fight this viral infection Take Quarcetin 500 mg three times a day x 7 days with Zinc 50 mg ones a day x 7 days. The quarcetin is an antiviral and anti-inflammatory supplement which helps open the zinc channels in the cell to absorb Zinc. Zinc helps decrease the virus load in your body. Take Melatonin 6-10 mg at bed time which also helps support your immune system.  Also make sure to take Vit D 5,000 IU per day with a fatty meal and Vit C 1000 mg a day until you are completely better. Stay on Vitamin D 2,000  and C the rest of the season.  Don't lay around, keep active and walk as much as you are able to to prevent worsening of your symptoms.  Follow up with your family Dr next week.  If you get short of breath and you are able to check  your oxygen with a pulse oxygen meter, if it gets to 92% or less, you need to go to the hospital to be admitted. If you dont have one, come back here and we will assess you.

## 2020-04-21 NOTE — ED Triage Notes (Signed)
Pt with left sided sore throat pain starting today with white spots.

## 2020-04-21 NOTE — ED Provider Notes (Signed)
[facility]    CSN: [id number] Arrival date & time: 04/21/20  1420      History   Chief Complaint Chief Complaint  Patient presents with  . Sore Throat    HPI Lisa Cowan is a 79 y.o. female who presents due to having L sore throat and she has seen white spots. Has noticed some green postnasal drainge.     Past Medical History:  Diagnosis Date  . Adrenal nodule ([place])   . Anemia   . Anginal pain ([place])   . Arthritis   . Cancer ([place])    cervical ca  . Complication of anesthesia   . COPD (chronic obstructive pulmonary disease) ([place])   . Depression   . Dyspnea    with exertion  . GERD (gastroesophageal reflux disease)   . Headache    h/o migraines  . Hyperlipidemia   . Hypertension   . PONV (postoperative nausea and vomiting)    x1 and no problems since  . Skin cancer   . Sleep apnea    CPAP    Patient Active Problem List   Diagnosis Date Noted  . Chest pain 06/05/2018  . HTN (hypertension) 06/05/2018  . HLD (hyperlipidemia) 06/05/2018  . OSA (obstructive sleep apnea) 06/05/2018  . GERD (gastroesophageal reflux disease) 06/05/2018  . COPD (chronic obstructive pulmonary disease) ([place]) 06/05/2018    Past Surgical History:  Procedure Laterality Date  . ABDOMINAL HYSTERECTOMY    . ADENOIDECTOMY    . BREAST BIOPSY Right 01/04/2016   stereo  . CARDIAC CATHETERIZATION Left 08/21/2016   Procedure: Left Heart Cath and Coronary Angiography;  Surgeon: Isaias Cowman, MD;  Location: [facility];  Service: Cardiovascular;  Laterality: Left;  . CATARACT EXTRACTION W/PHACO Right 05/18/2019   Procedure: CATARACT EXTRACTION PHACO AND INTRAOCULAR LENS PLACEMENT (IOC) Right  00:52.2  18.7%  9.59;  Surgeon: Leandrew Koyanagi, MD;  Location: [facility];  Service: Ophthalmology;  Laterality: Right;  sleep apnea  . CATARACT EXTRACTION W/PHACO Left 06/08/2019   Procedure: CATARACT EXTRACTION PHACO AND INTRAOCULAR LENS PLACEMENT (IOC) LEFT  9.60,    01:08.5,    14.0%;  Surgeon: Leandrew Koyanagi, MD;  Location: [facility];  Service: Ophthalmology;  Laterality: Left;  . COLONOSCOPY WITH PROPOFOL N/A 08/07/2017   Procedure: COLONOSCOPY WITH PROPOFOL;  Surgeon: Manya Silvas, MD;  [facility];  Service: Endoscopy;  Laterality: N/A;  . ESOPHAGOGASTRODUODENOSCOPY (EGD) WITH PROPOFOL N/A 08/07/2017   Procedure: ESOPHAGOGASTRODUODENOSCOPY (EGD) WITH PROPOFOL;  Surgeon: Manya Silvas, MD;  [facility]COPY;  Service: Endoscopy;  Laterality: N/A;  . FOOT SURGERY     multiple  . FRACTURE SURGERY     great toe fx  . ROTATOR CUFF REPAIR Left   . TONSILLECTOMY    . VIDEO BRONCHOSCOPY WITH ENDOBRONCHIAL NAVIGATION N/A 12/05/2019   Procedure: VIDEO BRONCHOSCOPY WITH ENDOBRONCHIAL NAVIGATION;  Surgeon: Ottie Glazier, MD;  Location: [facility];  Service: Thoracic;  Laterality: N/A;  . VIDEO BRONCHOSCOPY WITH ENDOBRONCHIAL ULTRASOUND N/A 12/05/2019   Procedure: VIDEO BRONCHOSCOPY WITH ENDOBRONCHIAL ULTRASOUND;  Surgeon: Ottie Glazier, MD;  Location: [facility];  Service: Thoracic;  Laterality: N/A;    OB History   No obstetric history on file.      Home Medications    Prior to Admission medications   Medication Sig Start Date End Date Taking? Authorizing Provider  atenolol (TENORMIN) 25 MG tablet Take 25 mg by mouth every morning.     [provider]  Cyanocobalamin (  VITAMIN B-12) 5000 MCG TBDP Take 5,000 mcg by mouth daily.    [provider]  FLUoxetine (PROZAC) 20 MG capsule Take 20 mg by mouth every morning.     [provider]  gabapentin (NEURONTIN) 300 MG capsule Take 300 mg by mouth 3 (three) times daily as needed (back pain.).     [provider]  ipratropium (ATROVENT) 0.03 % nasal spray Place 2 sprays into both nostrils daily. 06/09/19   [provider]  losartan (COZAAR) 50 MG tablet Take 50 mg by mouth every morning.  11/03/19   [provider]  meclizine (ANTIVERT) 12.5 MG tablet Take 1 tablet (12.5 mg total) by mouth 2 (two) times daily as needed for dizziness. Patient not taking: Reported on 12/05/2019 10/29/19   Duffy Bruce, MD  meloxicam (MOBIC) 7.5 MG tablet Take 7.5 mg by mouth daily.    [provider]  methocarbamol (ROBAXIN) 500 MG tablet Take 500 mg by mouth 3 (three) times daily as needed for spasms. 08/30/19   [provider]  Multiple Vitamin (MULTIVITAMIN WITH MINERALS) TABS tablet Take 1 tablet by mouth every other day.     [provider]  nystatin cream (MYCOSTATIN) Apply 1 application topically 2 (two) times daily. Sore under right breast    [provider]  omeprazole (PRILOSEC) 20 MG capsule Take 20 mg by mouth 2 (two) times daily before a meal.    [provider]  rosuvastatin (CRESTOR) 5 MG tablet Take 5 mg by mouth every Monday, Wednesday, and Friday.  03/17/18   [provider]  SPIRIVA RESPIMAT 2.5 MCG/ACT AERS Inhale 1 puff into the lungs daily. 05/30/18   [provider]  SYSTANE ULTRA 0.4-0.3 % SOLN Place 1 drop into both eyes daily.    [provider]  triamterene-hydrochlorothiazide (DYAZIDE) 37.5-25 MG capsule Take 1 capsule by mouth every morning.     [provider]    Family History Family History  Problem Relation Age of Onset  . Breast cancer Sister 84    Social History Social History   Tobacco Use  . Smoking status: Former Smoker    Packs/day: 1.00    Years: 30.00    Pack years: 30.00    Types: Cigarettes    Quit date: 2000    Years since quitting: 21.7  . Smokeless tobacco: Never Used  Vaping Use  . Vaping Use: Never used  Substance Use Topics  . Alcohol use: Yes    Comment: occassionally  . Drug use: No     Allergies   Codeine, Hydrocodone-acetaminophen, Nucynta [tapentadol], Oxycodone-acetaminophen, Statins, and Adhesive [tape]   Review of Systems Review of Systems    Constitutional: Positive for fatigue. Negative for appetite change, chills, diaphoresis and fever.  HENT: Positive for sore throat. Negative for congestion, postnasal drip, rhinorrhea and trouble swallowing.   Eyes: Negative for discharge.  Respiratory: Negative for cough, shortness of breath and wheezing.   Cardiovascular: Negative for chest pain.  Gastrointestinal: Negative for diarrhea, nausea and vomiting.  Musculoskeletal: Negative for gait problem.  Skin: Negative for rash.  Neurological: Negative for headaches.  Hematological: Negative for adenopathy.     Physical Exam Triage Vital Signs ED Triage Vitals  Enc Vitals Group     BP 04/21/20 1448 129/65     Pulse Rate 04/21/20 1448 63     Resp 04/21/20 1448 16     Temp 04/21/20 1448 97.9 F (36.6 C)     Temp Source 04/21/20  1448 Oral     SpO2 04/21/20 1448 98 %     Weight 04/21/20 1453 182 lb (82.6 kg)     Height 04/21/20 1453 4\' 11"  (1.499 m)     Head Circumference --      Peak Flow --      Pain Score 04/21/20 1449 5     Pain Loc --      Pain Edu? --      Excl. in [place]? --    No data found.  Updated Vital Signs BP 129/65 (BP Location: Right Arm)   Pulse 63   Temp 97.9 F (36.6 C) (Oral)   Resp 16   Ht 4\' 11"  (1.499 m)   Wt 182 lb (82.6 kg)   SpO2 98%   BMI 36.76 kg/m   Visual Acuity Right Eye Distance:   Left Eye Distance:   Bilateral Distance:    Right Eye Near:   Left Eye Near:    Bilateral Near:     Physical Exam Physical Exam Vitals signs and nursing note reviewed.  Constitutional:      General: She is not in acute distress.    Appearance: Normal appearance. She is not ill-appearing, toxic-appearing or diaphoretic.  HENT:     Head: Normocephalic.     Right Ear: Tympanic membrane, ear canal and external ear normal.     Left Ear: Tympanic membrane, ear canal and external ear normal.     Nose: Nose normal.     Mouth/Throat: mildly erythematous, and has white phlegm on pharynx. I dont see  exudate    Mouth: Mucous membranes are moist.  Eyes:     General: No scleral icterus.       Right eye: No discharge.        Left eye: No discharge.     Conjunctiva/sclera: Conjunctivae normal.  Neck:     Musculoskeletal: Neck supple. No neck rigidity.  Cardiovascular:     Rate and Rhythm: Normal rate and regular rhythm.     Heart sounds: No murmur.  Pulmonary:     Effort: Pulmonary effort is normal.     Breath sounds: Normal breath sounds.   Musculoskeletal: Normal range of motion.  Lymphadenopathy:     Cervical: No cervical adenopathy.  Skin:    General: Skin is warm and dry.     Coloration: Skin is not jaundiced.     Findings: No rash.  Neurological:     Mental Status: She is alert and oriented to person, place, and time.     Gait: Gait normal.  Psychiatric:        Mood and Affect: Mood normal.        Behavior: Behavior normal.        Thought Content: Thought content normal.        Judgment: Judgment normal.    UC Treatments / Results  Labs (all labs ordered are listed, but only abnormal results are displayed) Labs Reviewed  GROUP A STREP BY PCR  SARS CORONAVIRUS 2 (TAT 6-24 HRS)  Strep test neg.   EKG   Radiology No results found.  Procedures Procedures (including critical care time)  Medications Ordered in UC Medications - No data to display  Initial Impression / Assessment and Plan / UC Course  I have reviewed the triage vital signs and the nursing notes. Covid test is pending.  Pertinent labs  results that were available during my care of the patient were reviewed by me and considered in my  medical decision making (see chart for details). If covid positive she is interested in the infusion treatment.   Final Clinical Impressions(s) / UC Diagnoses   Final diagnoses:  Acute pharyngitis, unspecified etiology     Discharge Instructions     Due to your chronic conditions and if your covid test is positive, you qualify to receive Monoclonal Antibody  infusion and if you don't hear from anyone within 24 hours of finding your results, call [phone] If your Covid test ends up positive you may take the following supplements to help your immune system be stronger to fight this viral infection Take Quarcetin 500 mg three times a day x 7 days with Zinc 50 mg ones a day x 7 days. The quarcetin is an antiviral and anti-inflammatory supplement which helps open the zinc channels in the cell to absorb Zinc. Zinc helps decrease the virus load in your body. Take Melatonin 6-10 mg at bed time which also helps support your immune system.  Also make sure to take Vit D 5,000 IU per day with a fatty meal and Vit C 1000 mg a day until you are completely better. Stay on Vitamin D 2,000  and C the rest of the season.  Don't lay around, keep active and walk as much as you are able to to prevent worsening of your symptoms.  Follow up with your family Dr next week.  If you get short of breath and you are able to check  your oxygen with a pulse oxygen meter, if it gets to 92% or less, you need to go to the hospital to be admitted. If you dont have one, come back here and we will assess you.      ED Prescriptions    None     PDMP not reviewed this encounter.   Shelby Mattocks, [place] 04/21/20 1617

## 2020-04-22 LAB — SARS CORONAVIRUS 2 (TAT 6-24 HRS): SARS Coronavirus 2: NEGATIVE

## 2020-04-23 ENCOUNTER — Ambulatory Visit
Admission: RE | Admit: 2020-04-23 | Discharge: 2020-04-23 | Disposition: A | Discharge status: Home / Self Care | Payer: PPO | Source: Ambulatory Visit | Attending: Specialist | Admitting: Specialist

## 2020-04-23 ENCOUNTER — Other Ambulatory Visit: Payer: Self-pay

## 2020-04-23 DIAGNOSIS — Z8541 Personal history of malignant neoplasm of cervix uteri: Secondary | ICD-10-CM | POA: Diagnosis not present

## 2020-04-23 DIAGNOSIS — R911 Solitary pulmonary nodule: Secondary | ICD-10-CM | POA: Insufficient documentation

## 2020-04-23 DIAGNOSIS — I2510 Atherosclerotic heart disease of native coronary artery without angina pectoris: Secondary | ICD-10-CM | POA: Diagnosis not present

## 2020-04-23 DIAGNOSIS — I700 Atherosclerosis of aorta: Secondary | ICD-10-CM | POA: Diagnosis not present

## 2020-04-23 DIAGNOSIS — R0609 Other forms of dyspnea: Secondary | ICD-10-CM | POA: Diagnosis not present

## 2020-04-30 ENCOUNTER — Other Ambulatory Visit: Payer: Self-pay | Admitting: *Deleted

## 2020-05-03 ENCOUNTER — Other Ambulatory Visit: Payer: PPO

## 2020-05-03 NOTE — Progress Notes (Signed)
Tumor Board Documentation  [place] was presented by Army Chaco, RN at our Tumor Board on 05/03/2020, which included representatives from medical oncology, radiation oncology, surgical oncology, internal medicine, navigation, pathology, radiology, surgical, pharmacy, genetics, research, palliative care, pulmonology.  Neriyah currently presents as an external consult, for discussion with history of the following treatments: active survellience.  Additionally, we reviewed previous medical and familial history, history of present illness, and recent lab results along with all available histopathologic and imaging studies. The tumor board considered available treatment options and made the following recommendations: Additional screening, Biopsy (PET, ENB Biopsy)    The following procedures/referrals were also placed: No orders of the defined types were placed in this encounter.   Clinical Trial Status:     Staging used: Not Applicable  National site-specific guidelines   were discussed with respect to the case.  Tumor board is a meeting of clinicians from various specialty areas who evaluate and discuss patients for whom a multidisciplinary approach is being considered. Final determinations in the plan of care are those of the provider(s). The responsibility for follow up of recommendations given during tumor board is that of the provider.   Today's extended care, comprehensive team conference, Michaelina was not present for the discussion and was not examined.   Multidisciplinary Tumor Board is a multidisciplinary case peer review process.  Decisions discussed in the Multidisciplinary Tumor Board reflect the opinions of the specialists present at the conference without having examined the patient.  Ultimately, treatment and diagnostic decisions rest with the primary provider(s) and the patient.

## 2020-05-07 ENCOUNTER — Telehealth: Payer: Self-pay | Admitting: *Deleted

## 2020-05-07 NOTE — Telephone Encounter (Signed)
Contacted patient and reviewed recent CT scan results as well as the recommendation from multidisciplinary thoracic conference for further evaluation of PET positive lung nodules.   Patient was not aware of the CT results or that review of her imaging in conference had been requested.   Patient and daughter were present on this call and report that patient's husband is being transitioned to hospice services.   Discussed following up with patient in 2 weeks to evaluate timing of the recommended PET scan and other evaluation.   Patient and daughter are in agreement with this plan.

## 2020-05-25 NOTE — Telephone Encounter (Signed)
Contacted patient to inquire about arranging for follow up imaging from Ct scan. Patient reports that husband expired recently. She is not able to do anything else at this time. Patient knows how to contact me to arrange for follow up when she is able.

## 2020-05-31 DIAGNOSIS — G4733 Obstructive sleep apnea (adult) (pediatric): Secondary | ICD-10-CM | POA: Diagnosis not present

## 2020-06-19 ENCOUNTER — Other Ambulatory Visit: Payer: Self-pay | Admitting: Internal Medicine

## 2020-06-19 ENCOUNTER — Ambulatory Visit: Payer: PPO | Attending: Internal Medicine

## 2020-06-19 DIAGNOSIS — Z23 Encounter for immunization: Secondary | ICD-10-CM

## 2020-06-19 NOTE — Progress Notes (Signed)
   Covid-19 Vaccination [facility]  Name:  Lisa Cowan    MRN: [id number] DOB: 01-05-1941  06/19/2020  [name] was observed post Covid-19 immunization for 15 minutes without incident. She was provided with Vaccine Information Sheet and instruction to access the V-Safe system.   [name] was instructed to call 911 with any severe reactions post vaccine: Marland Kitchen Difficulty breathing  . Swelling of face and throat  . A fast heartbeat  . A bad rash all over body  . Dizziness and weakness   Immunizations Administered    Name Date Dose VIS Date Route   Pfizer COVID-19 Vaccine 06/19/2020 11:07 AM 0.3 mL 05/09/2020 Intramuscular   Manufacturer: [place]   Lot: [id number]   [place]: [address]-3

## 2020-06-27 ENCOUNTER — Telehealth: Payer: Self-pay | Admitting: *Deleted

## 2020-06-27 NOTE — Telephone Encounter (Addendum)
Voicemail left for patient in attempt to schedule PET scan. As noted previously.  08/08/20 patient called and is ready to schedule PET, appointment given for 08/21/20 at 1pm arrival. Patient knows to call for any questions or concerns.

## 2020-07-03 IMAGING — CT CT CHEST SUPER D W/O CM
2 of 5 series · 15 of 36 positions shown, 18 images · non-contrast
Comparison: PET-CT 11/03/2019, CT 09/19/2019

CLINICAL DATA: Lung nodule

EXAM:
CT CHEST WITHOUT CONTRAST
TECHNIQUE: Multidetector CT imaging of the chest was performed using thin slice
collimation for electromagnetic bronchoscopy planning purposes,
without intravenous contrast.

[Series 4: thins chest 1.00 · axial · 0.59mm/px · z∈[-1109,-849]mm · 12 of 429 slices shown, 15 images]
[im 29/429  mediastinal]
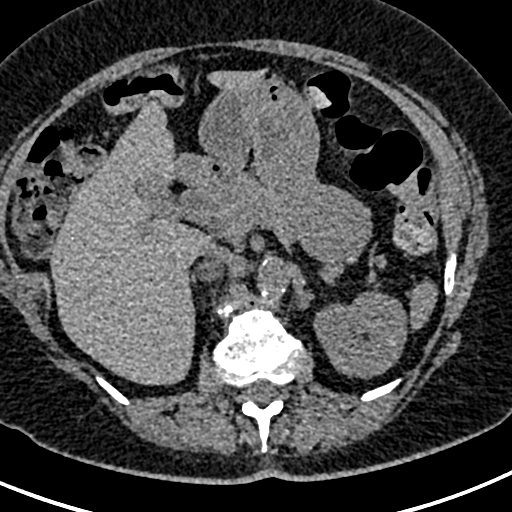
[im 29/429  lung]
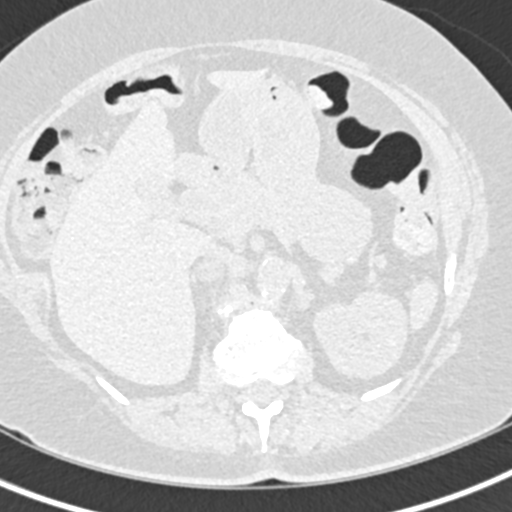
[im 58/429  lung]
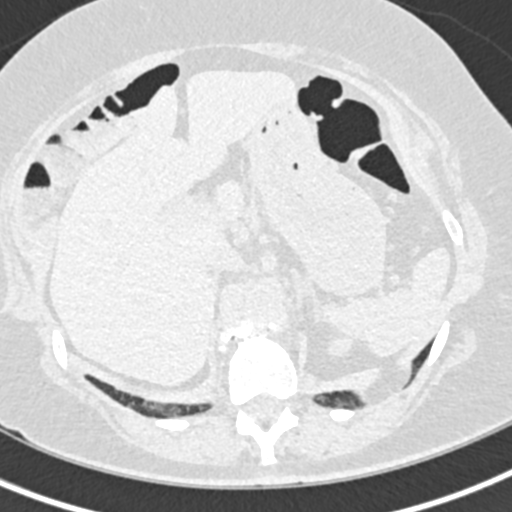
[im 86/429  lung]
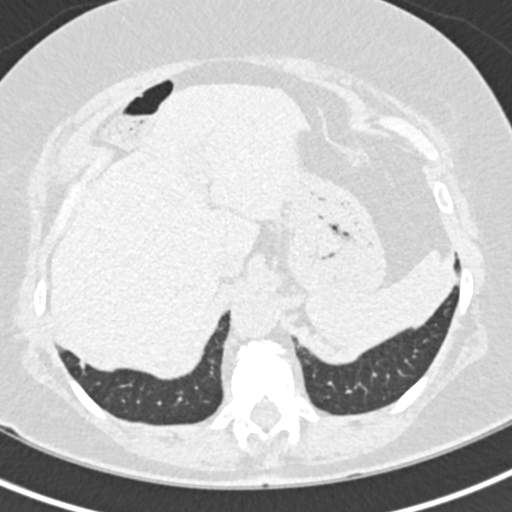
[im 143/429  lung]
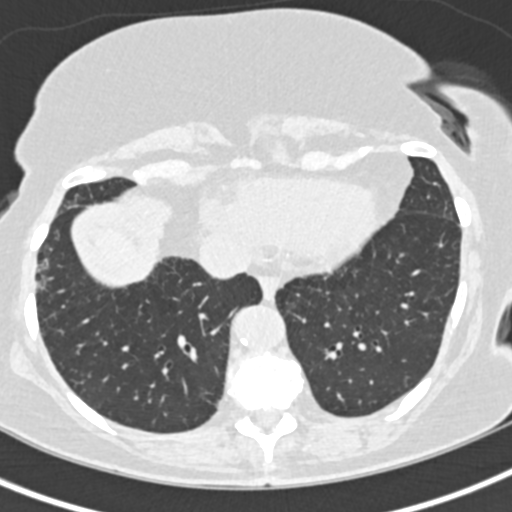
[im 172/429  mediastinal]
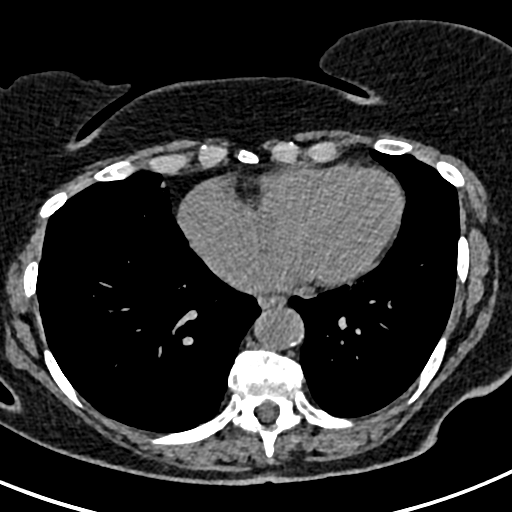
[im 172/429  lung]
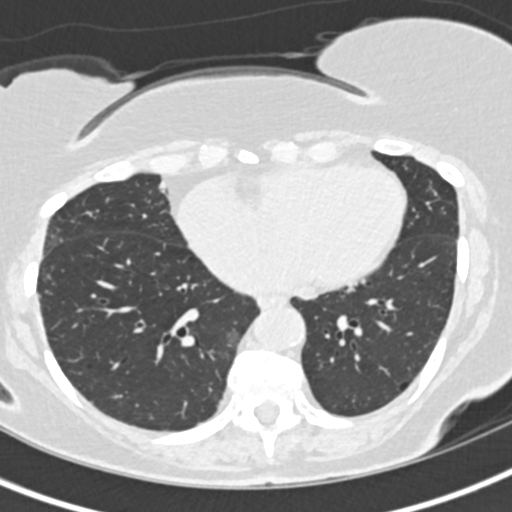
[im 200/429  lung]
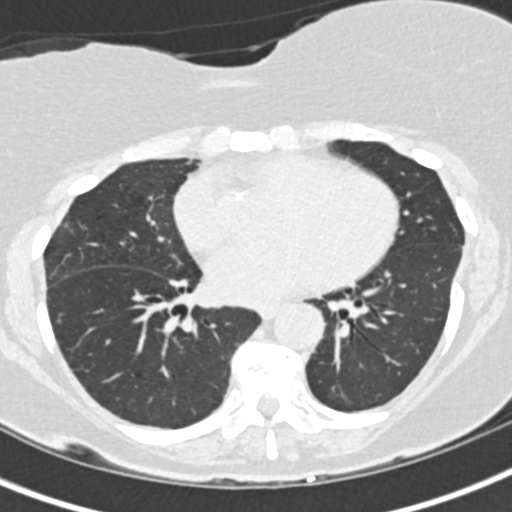
[im 229/429  lung]
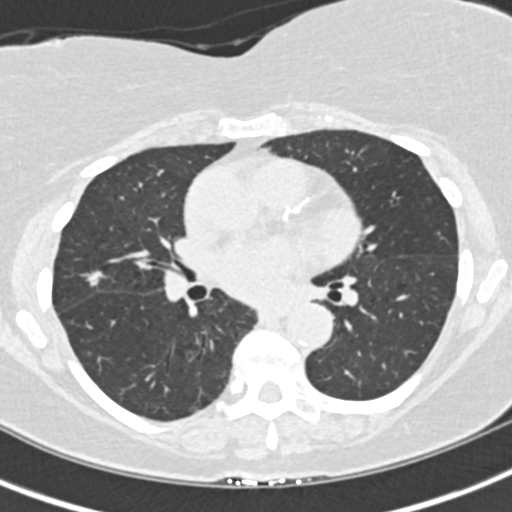
[im 257/429  lung]
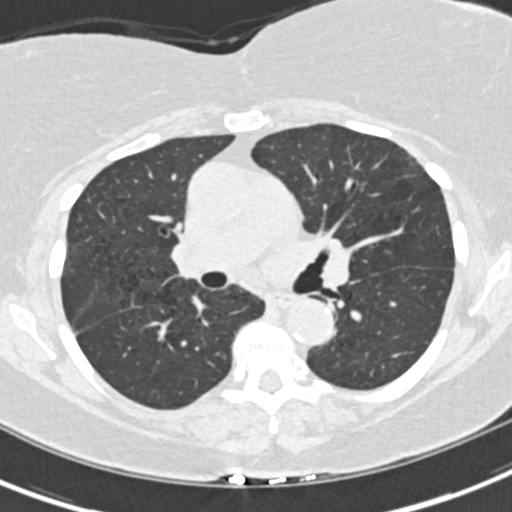
[im 286/429  mediastinal]
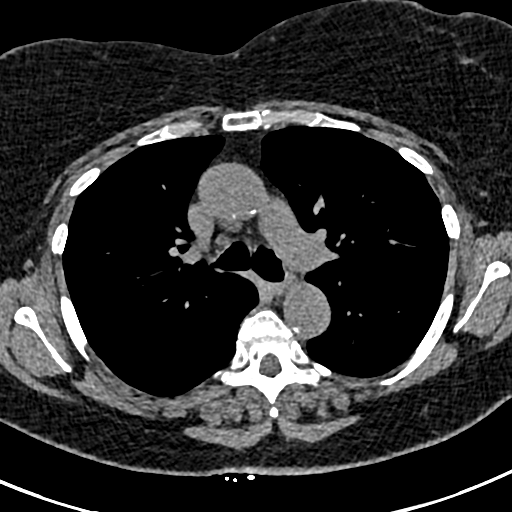
[im 286/429  lung]
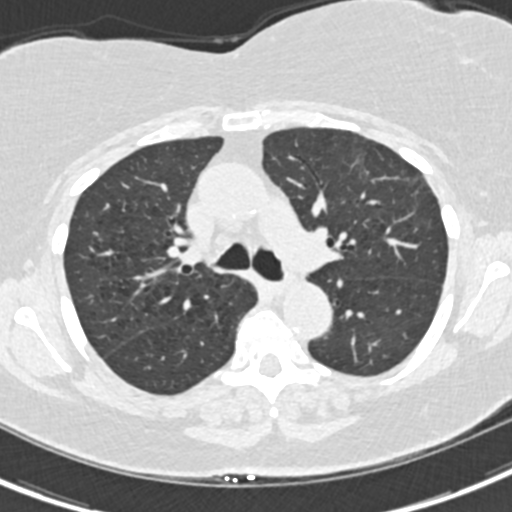
[im 343/429  lung]
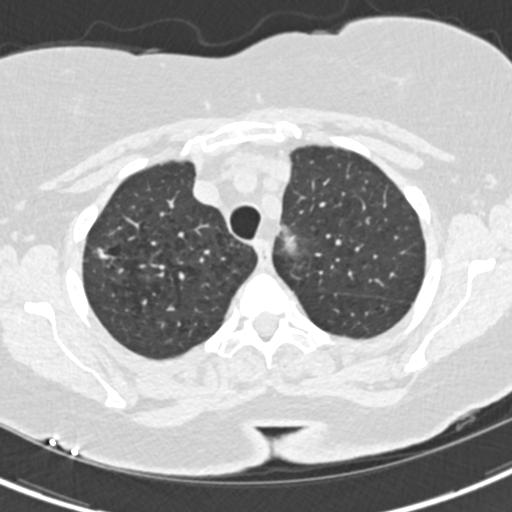
[im 371/429  lung]
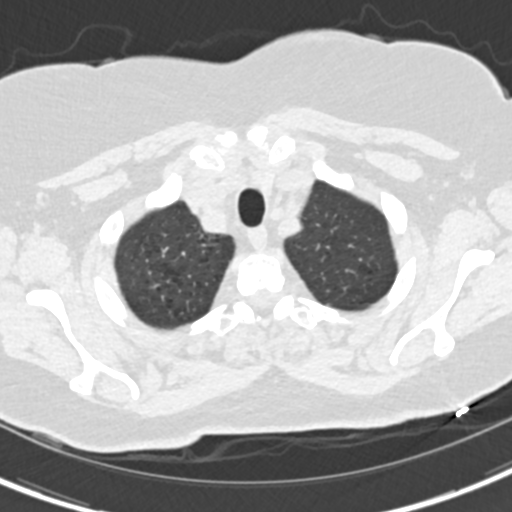
[im 400/429  lung]
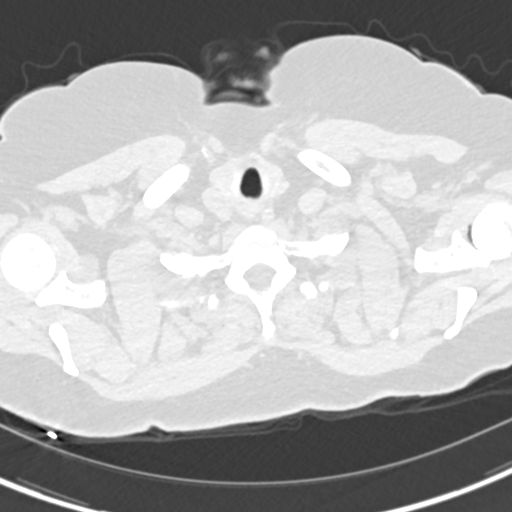

[Series 5: coronals chest 2.00 cor · coronal · 0.54mm/px · 3 of 123 slices shown]
[im 25/123  lung]
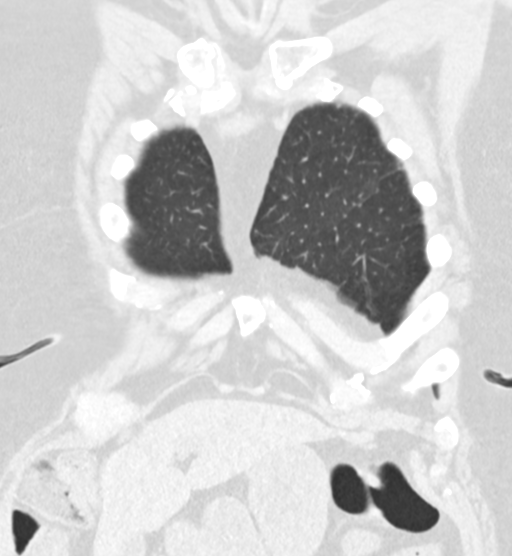
[im 49/123  lung]
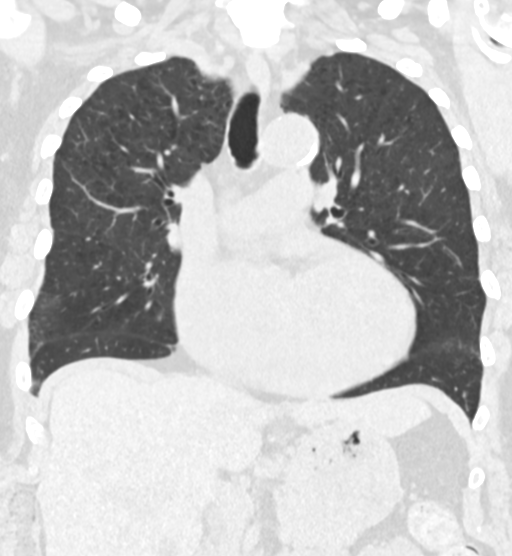
[im 74/123  lung]
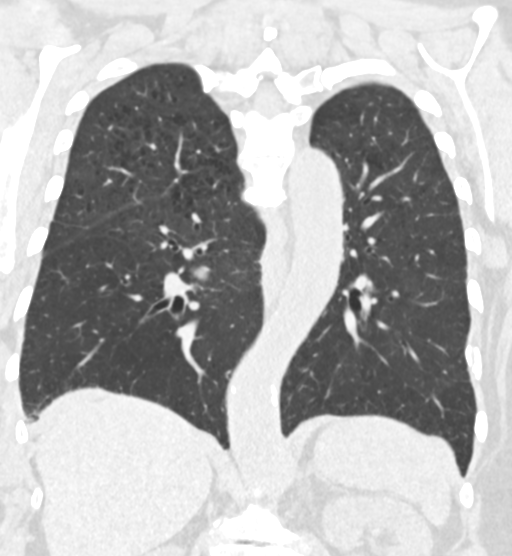

[15 of 36 positions shown; findings below may reference images not displayed]

FINDINGS: Cardiovascular: Coronary artery calcification and aortic
atherosclerotic calcification.

Mediastinum/Nodes: No axillary supraclavicular adenopathy. No
mediastinal hilar adenopathy no pericardial effusion. Esophagus.

Lungs/Pleura: Again demonstrated irregular peripheral nodule in the
RIGHT upper lobe measuring 10 mm (image [DATE]) not changed from
prior.

Second nodule in the inferior RIGHT upper lobe measuring 9 mm (image
70/3) also unchanged.

Third nodule in the RIGHT middle lobe measuring 5 mm (image 84)
unchanged.

No new pulmonary nodularity.

Upper Abdomen: Limited view of the liver, kidneys, pancreas are
unremarkable. RIGHT adrenal adenoma.

Musculoskeletal: No acute osseous abnormality.
IMPRESSION: 1. No significant change in three RIGHT lung pulmonary nodules
compared to CT 11/19/2019.
[DATE]. RIGHT adrenal adenoma.

## 2020-08-07 DIAGNOSIS — R739 Hyperglycemia, unspecified: Secondary | ICD-10-CM | POA: Diagnosis not present

## 2020-08-07 DIAGNOSIS — K219 Gastro-esophageal reflux disease without esophagitis: Secondary | ICD-10-CM | POA: Diagnosis not present

## 2020-08-07 DIAGNOSIS — R21 Rash and other nonspecific skin eruption: Secondary | ICD-10-CM | POA: Diagnosis not present

## 2020-08-07 DIAGNOSIS — F3340 Major depressive disorder, recurrent, in remission, unspecified: Secondary | ICD-10-CM | POA: Diagnosis not present

## 2020-08-07 DIAGNOSIS — I10 Essential (primary) hypertension: Secondary | ICD-10-CM | POA: Diagnosis not present

## 2020-08-07 DIAGNOSIS — J439 Emphysema, unspecified: Secondary | ICD-10-CM | POA: Diagnosis not present

## 2020-08-08 ENCOUNTER — Other Ambulatory Visit: Payer: Self-pay | Admitting: *Deleted

## 2020-08-08 DIAGNOSIS — R911 Solitary pulmonary nodule: Secondary | ICD-10-CM

## 2020-08-14 DIAGNOSIS — R911 Solitary pulmonary nodule: Secondary | ICD-10-CM | POA: Diagnosis not present

## 2020-08-14 DIAGNOSIS — I10 Essential (primary) hypertension: Secondary | ICD-10-CM | POA: Diagnosis not present

## 2020-08-14 DIAGNOSIS — Z23 Encounter for immunization: Secondary | ICD-10-CM | POA: Diagnosis not present

## 2020-08-14 DIAGNOSIS — R7309 Other abnormal glucose: Secondary | ICD-10-CM | POA: Diagnosis not present

## 2020-08-14 DIAGNOSIS — G4733 Obstructive sleep apnea (adult) (pediatric): Secondary | ICD-10-CM | POA: Diagnosis not present

## 2020-08-14 DIAGNOSIS — J439 Emphysema, unspecified: Secondary | ICD-10-CM | POA: Diagnosis not present

## 2020-08-14 DIAGNOSIS — F3340 Major depressive disorder, recurrent, in remission, unspecified: Secondary | ICD-10-CM | POA: Diagnosis not present

## 2020-08-14 DIAGNOSIS — Z0000 Encounter for general adult medical examination without abnormal findings: Secondary | ICD-10-CM | POA: Diagnosis not present

## 2020-08-14 DIAGNOSIS — I700 Atherosclerosis of aorta: Secondary | ICD-10-CM | POA: Diagnosis not present

## 2020-08-21 ENCOUNTER — Encounter
Admission: RE | Admit: 2020-08-21 | Discharge: 2020-08-21 | Disposition: A | Discharge status: Home / Self Care | Payer: PPO | Source: Ambulatory Visit | Attending: Oncology | Admitting: Oncology

## 2020-08-21 ENCOUNTER — Other Ambulatory Visit: Payer: Self-pay

## 2020-08-21 DIAGNOSIS — I700 Atherosclerosis of aorta: Secondary | ICD-10-CM | POA: Insufficient documentation

## 2020-08-21 DIAGNOSIS — J439 Emphysema, unspecified: Secondary | ICD-10-CM | POA: Insufficient documentation

## 2020-08-21 DIAGNOSIS — R918 Other nonspecific abnormal finding of lung field: Secondary | ICD-10-CM | POA: Insufficient documentation

## 2020-08-21 DIAGNOSIS — I2510 Atherosclerotic heart disease of native coronary artery without angina pectoris: Secondary | ICD-10-CM | POA: Diagnosis not present

## 2020-08-21 DIAGNOSIS — I517 Cardiomegaly: Secondary | ICD-10-CM | POA: Diagnosis not present

## 2020-08-21 DIAGNOSIS — R911 Solitary pulmonary nodule: Secondary | ICD-10-CM | POA: Diagnosis not present

## 2020-08-21 DIAGNOSIS — D3501 Benign neoplasm of right adrenal gland: Secondary | ICD-10-CM | POA: Diagnosis not present

## 2020-08-21 LAB — GLUCOSE, CAPILLARY: Glucose-Capillary: 101 mg/dL — ABNORMAL HIGH (ref 70–99)

## 2020-08-21 MED ORDER — FLUDEOXYGLUCOSE F - 18 (FDG) INJECTION
9.4000 | Freq: Once | INTRAVENOUS | Status: AC | PRN
  Administered 2020-08-21: 10.12 via INTRAVENOUS

## 2020-08-22 NOTE — Progress Notes (Signed)
[facility]  [address] [place], [place] [address] Phone: [phone]  Fax: [phone]   Clinic Day:  08/23/2020  Referring physician: Tracie Harrier, MD  Chief Complaint: Lisa Cowan is a 80 y.o. female with pulmonary nodules who is referred in consultation by Dr. Tracie Harrier for assessment and management.   HPI: The patient smoked a pack per day for about 40 years. She quit smoking in 2020. She is followed by Dr Raul Del.   She was admitted to [facility] from 06/05/2018 - 06/07/2018 for chest pain. Chest CT angiogram on 06/07/2018 revealed a 7 mm pulmonary nodule in the right middle lobe. There was a 7 mm scar or sub solid nodule in the right upper lobe. Follow-up non-contrast CT was recommended at 3-6 months to confirm persistence. There was aortic atherosclerosis, coronary artery calcification, and emphysema.  Chest CT without contrast on 09/20/2018 revealed that the sub solid right upper lobe nodule and solid right middle lobe nodule were stable from 06/07/2018. Follow-up CT chest without contrast in 1 year was recommended as adenocarcinoma could not be excluded. There was a right adrenal adenoma. There was aortic atherosclerosis, coronary artery calcification, emphysema   Chest CT without contrast on 09/19/2019 revealed a part solid irregular nodule in the apical right upper lobe that appeared progressive from 09/20/2018, with an enlarging solid component. Findings were worrisome for primary bronchogenic carcinoma. There was a new 4 mm medial segment right middle lobe nodule. The 7 mm lateral segment right middle lobe nodule was stable. Continued attention on follow-up exams was warranted. There was new borderline left axillary adenopathy. Left subpectoral lymph nodes were subcentimeter in size but new from 09/20/2018. There was question mild basilar pulmonary fibrosis. There was right adrenal adenoma. There was aortic atherosclerosis, coronary  artery calcification, and emphysema   PET scan on 11/03/2019 revealed that the right upper lobe and right middle lobe pulmonary nodules did not demonstrate any definite hypermetabolism but both were still worrisome and should be followed closely. Recommended follow-up noncontrast chest CT in 3-6 months. There were no enlarged or hypermetabolic mediastinal or hilar lymph nodes. There were stable emphysematous changes and atherosclerotic vascular disease.  Super D Chest CT without contrast on 12/01/2019 revealed no significant change in three RIGHT lung pulmonary nodules compared to CT 11/19/2019. There was a RIGHT adrenal adenoma.  Video bronchoscopy with endobronchial ultrasound on 12/05/2019 by Dr. Lanney Gins revealed friable mucosa at the medial segment of the RML. Right middle lobe brushings and biopsy showed benign lung tissue. The sample was nondiagnostic; there were no features that would account for the appearance of a lung nodule.   Chest CT without contrast on 04/23/2020 revealed 3 growing scattered spiculated irregular solid pulmonary nodules in the right lung suspicious for synchronous primary bronchogenic carcinomas, measuring 1.1 cm in the peripheral apical right upper lobe, 1.0 cm in the peripheral right middle lobe, and 0.9 cm in the medial right middle lobe. Multidisciplinary thoracic oncology consultation was suggested. There was no thoracic adenopathy. There was one vessel coronary atherosclerosis. There was stable right adrenal adenoma. There was aortic atherosclerosis and emphysema.  Tumor Board on 05/03/2020 recommended additional screening and biopsy (PET, ENB biopsy)  PET scan on 08/21/2020 showed 3 slow growing right lung pulmonary nodules. Two of these nodules had an SUV very similar to the 11/03/2019 exam, exhibiting low-grade activity.  Right upper lobe pulmonary nodule measured 1.2 x 0.6 centimeters (SUV 2.3), right middle lobe cephalad nodule measured 1.1 x 0.9 cm (  SUV 1.0),  and right middle lobe caudal/anterior nodule measured 1.1 x 0.9 cm (SUV 3.1).  Right middle lobe lesion closest to the diaphragm exhibited increased activity, maximum SUV 3.1 (formerly 1.3) as compared to prior PET scan. Proximity to the diaphragm can cause motion artifact which can reduce accuracy of SUV readings. 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 Other imaging findings of potential clinical significance: aortic atherosclerosis and emphysema, coronary atherosclerosis, mild cardiomegaly, a small right adrenal adenoma, fascial defect and small hernia along the left lateral abdominal wall musculature near the iliac crest, and thoracic and lumbar spondylosis. There was egenerative glenohumeral arthropathy (L>R). There was a small and likely clinically inconsequential suspected meningioma anteriorly along the left middle cranial fossa.  Symptomatically, she is doing well. She coughs when she gets hot and has shortness of breath on exertion. She gets headaches every once in a while. She had 2 cataract surgeries last year. She has back pain. Her bowel movements are regular. She has sleep apnea and uses a CPAP machine.  She has a rash on her abdomen that appeared 2 years ago. It sometimes appears on her chest as well. She has been on several medications. Per patient, she had it biopsied and "it said nothing."  Currently, she is on fluconazole.  She denies fevers, sweats, runny nose, sore throat, chest pain, palpitations, nausea, vomiting, diarrhea, reflux, urinary symptoms, bone or joint symptoms, numbness, weakness, balance or coordination problems, and bleeding of any kind.  The patient has a history of cervical cancer in 1975. It was found on PAP smear. She had a hysterectomy. She is unsure about the staging. She has also had skin cancer; she does not think it was melanoma.  Colonoscopy on 08/07/2017  revealed one polyp (tubular adenoma).  Family history is notable for one of her sisters died of pancreatic cancer. Another sister had lung, liver, and bladder cancer at different times. One of her brothers had lung cancer. Another brother had colon cancer. One of her cousins had breast cancer and another cousin had brain cancer.   Past Medical History:  Diagnosis Date  . Adrenal nodule ([place])   . Anemia   . Anginal pain ([place])   . Arthritis   . Cancer ([place])    cervical ca  . Complication of anesthesia   . COPD (chronic obstructive pulmonary disease) ([place])   . Depression   . Dyspnea    with exertion  . GERD (gastroesophageal reflux disease)   . Headache    h/o migraines  . Hyperlipidemia   . Hypertension   . PONV (postoperative nausea and vomiting)    x1 and no problems since  . Skin cancer   . Sleep apnea    CPAP    Past Surgical History:  Procedure Laterality Date  . ABDOMINAL HYSTERECTOMY    . ADENOIDECTOMY    . BREAST BIOPSY Right 01/04/2016   stereo  . CARDIAC CATHETERIZATION Left 08/21/2016   Procedure: Left Heart Cath and Coronary Angiography;  Surgeon: Isaias Cowman, MD;  Location: [facility];  Service: Cardiovascular;  Laterality: Left;  . CATARACT EXTRACTION W/PHACO Right 05/18/2019   Procedure: CATARACT EXTRACTION PHACO AND INTRAOCULAR LENS PLACEMENT (IOC) Right  00:52.2  18.7%  9.59;  Surgeon: Leandrew Koyanagi, MD;  Location: [facility];  Service: Ophthalmology;  Laterality: Right;  sleep apnea  . CATARACT EXTRACTION W/PHACO Left 06/08/2019   Procedure: CATARACT EXTRACTION  PHACO AND INTRAOCULAR LENS PLACEMENT (IOC) LEFT 9.60,    01:08.5,    14.0%;  Surgeon: Leandrew Koyanagi, MD;  Location: [facility];  Service: Ophthalmology;  Laterality: Left;  . COLONOSCOPY WITH PROPOFOL N/A 08/07/2017   Procedure: COLONOSCOPY WITH PROPOFOL;  Surgeon: Manya Silvas, MD;  Location: [facility];  Service: Endoscopy;  Laterality: N/A;  .  ESOPHAGOGASTRODUODENOSCOPY (EGD) WITH PROPOFOL N/A 08/07/2017   Procedure: ESOPHAGOGASTRODUODENOSCOPY (EGD) WITH PROPOFOL;  Surgeon: Manya Silvas, MD;  Location: [facility] ENDOSCOPY;  Service: Endoscopy;  Laterality: N/A;  . FOOT SURGERY     multiple  . FRACTURE SURGERY     great toe fx  . ROTATOR CUFF REPAIR Left   . TONSILLECTOMY    . VIDEO BRONCHOSCOPY WITH ENDOBRONCHIAL NAVIGATION N/A 12/05/2019   Procedure: VIDEO BRONCHOSCOPY WITH ENDOBRONCHIAL NAVIGATION;  Surgeon: Ottie Glazier, MD;  Location: [facility];  Service: Thoracic;  Laterality: N/A;  . VIDEO BRONCHOSCOPY WITH ENDOBRONCHIAL ULTRASOUND N/A 12/05/2019   Procedure: VIDEO BRONCHOSCOPY WITH ENDOBRONCHIAL ULTRASOUND;  Surgeon: Ottie Glazier, MD;  Location: [facility];  Service: Thoracic;  Laterality: N/A;    Family History  Problem Relation Age of Onset  . Breast cancer Sister 12    Social History:  reports that she quit smoking about 22 years ago. Her smoking use included cigarettes. She has a 30.00 pack-year smoking history. She has never used smokeless tobacco. She reports current alcohol use. She reports that she does not use drugs.She smoked a pack per day for about 40 years and quit in 2020. 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 She lives in [place]. The patient is accompanied by her daughter, Jovita Gamma, today.  Allergies:  Allergies  Allergen Reactions  . Codeine Itching and Other (See Comments)    hallucinations  . Hydrocodone-Acetaminophen Itching and Other (See Comments)    hallucinations  . Nucynta [Tapentadol] Itching and Other (See Comments)    hallucinations  . Oxycodone-Acetaminophen Itching and Other (See Comments)    hallucinations  . Statins     Myalgia when taken daily  . Adhesive [Tape] Rash    Current Medications: Current Outpatient Medications  Medication Sig Dispense Refill  . atenolol (TENORMIN) 25 MG tablet Take 25 mg by mouth every morning.     . Cyanocobalamin (VITAMIN B-12) 5000 MCG TBDP  Take 5,000 mcg by mouth daily.    . fluconazole (DIFLUCAN) 100 MG tablet Take by mouth.    Marland Kitchen FLUoxetine (PROZAC) 20 MG capsule Take 20 mg by mouth every morning.     . gabapentin (NEURONTIN) 300 MG capsule Take 300 mg by mouth 3 (three) times daily as needed (back pain.).     Marland Kitchen ipratropium (ATROVENT) 0.03 % nasal spray Place 2 sprays into both nostrils daily.    Marland Kitchen losartan (COZAAR) 50 MG tablet Take 50 mg by mouth every morning.     . meloxicam (MOBIC) 7.5 MG tablet Take 7.5 mg by mouth daily.    . Multiple Vitamin (MULTIVITAMIN WITH MINERALS) TABS tablet Take 1 tablet by mouth every other day.     . nystatin cream (MYCOSTATIN) Apply 1 application topically 2 (two) times daily. Sore under right breast    . omeprazole (PRILOSEC) 20 MG capsule Take 20 mg by mouth 2 (two) times daily before a meal.    . rosuvastatin (CRESTOR) 5 MG tablet Take 5 mg by mouth every Monday, Wednesday, and Friday.   11  . SPIRIVA RESPIMAT 2.5 MCG/ACT AERS Inhale 1 puff into the  lungs daily.    . SYSTANE ULTRA 0.4-0.3 % SOLN Place 1 drop into both eyes daily.    Marland Kitchen triamterene-hydrochlorothiazide (DYAZIDE) 37.5-25 MG capsule Take 1 capsule by mouth every morning.     . meclizine (ANTIVERT) 12.5 MG tablet Take 1 tablet (12.5 mg total) by mouth 2 (two) times daily as needed for dizziness. (Patient not taking: No sig reported) 15 tablet 0  . methocarbamol (ROBAXIN) 500 MG tablet Take 500 mg by mouth 3 (three) times daily as needed for spasms. (Patient not taking: Reported on 08/23/2020)     No current facility-administered medications for this visit.    Review of Systems  Constitutional: Negative for chills, diaphoresis, fever, malaise/fatigue and weight loss.  HENT: Negative for congestion, ear discharge, ear pain, hearing loss, nosebleeds, sinus pain, sore throat and tinnitus.   Eyes: Negative for blurred vision.  Respiratory: Positive for cough (when she gets hot) and shortness of breath (on exertion). Negative for  hemoptysis and sputum production.        Sleep apnea. Uses CPAP.  Cardiovascular: Negative for chest pain, palpitations and leg swelling.  Gastrointestinal: Negative for abdominal pain, blood in stool, constipation, diarrhea, heartburn, melena, nausea and vomiting.  Genitourinary: Negative for dysuria, frequency, hematuria and urgency.  Musculoskeletal: Positive for back pain. Negative for joint pain, myalgias and neck pain.       Arthritis  Skin: Positive for rash (abdomen, x 2 years). Negative for itching.  Neurological: Positive for headaches (occasional). Negative for dizziness, tingling, sensory change and weakness.  Endo/Heme/Allergies: Does not bruise/bleed easily.  Psychiatric/Behavioral: Negative for depression and memory loss. The patient is not nervous/anxious and does not have insomnia.   All other systems reviewed and are negative.  Performance status (ECOG): 1  Vitals Blood pressure 128/70, pulse 87, temperature (!) 97 F (36.1 C), temperature source Tympanic, resp. rate 16, weight 182 lb 12.2 oz (82.9 kg), SpO2 96 %.   Physical Exam Vitals and nursing note reviewed.  Constitutional:      General: She is not in acute distress.    Appearance: She is not diaphoretic.  HENT:     Head: Normocephalic and atraumatic.     Comments: Short gray hair.    Mouth/Throat:     Mouth: Mucous membranes are moist.     Pharynx: Oropharynx is clear.  Eyes:     General: No scleral icterus.    Extraocular Movements: Extraocular movements intact.     Conjunctiva/sclera: Conjunctivae normal.     Pupils: Pupils are equal, round, and reactive to light.     Comments: Blue eyes.  Cardiovascular:     Rate and Rhythm: Normal rate and regular rhythm.     Heart sounds: Normal heart sounds. No murmur heard.   Pulmonary:     Effort: Pulmonary effort is normal. No respiratory distress.     Breath sounds: Normal breath sounds. No wheezing or rales.  Chest:     Chest wall: No tenderness.   Breasts:     Right: No axillary adenopathy or supraclavicular adenopathy.     Left: No axillary adenopathy or supraclavicular adenopathy.    Abdominal:     General: Bowel sounds are normal. There is no distension.     Palpations: Abdomen is soft. There is no mass.     Tenderness: There is no abdominal tenderness. There is no guarding or rebound.  Musculoskeletal:        General: No swelling or tenderness. Normal range of motion.  Cervical back: Normal range of motion and neck supple.  Lymphadenopathy:     Head:     Right side of head: No preauricular, posterior auricular or occipital adenopathy.     Left side of head: No preauricular, posterior auricular or occipital adenopathy.     Cervical: No cervical adenopathy.     Upper Body:     Right upper body: No supraclavicular or axillary adenopathy.     Left upper body: No supraclavicular or axillary adenopathy.     Lower Body: No right inguinal adenopathy. No left inguinal adenopathy.  Skin:    General: Skin is warm and dry.  Neurological:     Mental Status: She is alert and oriented to person, place, and time.  Psychiatric:        Behavior: Behavior normal.        Thought Content: Thought content normal.        Judgment: Judgment normal.    Imaging studies: 06/07/2018:  Chest CT angiogram revealed a 7 mm pulmonary nodule in the right middle lobe. There was a 7 mm scar or sub solid nodule in the right upper lobe. Follow-up non-contrast CT was recommended at 3-6 months to confirm persistence. There was aortic atherosclerosis, coronary artery calcification, and emphysema. 09/20/2018:  Chest CT without contrast revealed that the sub solid right upper lobe nodule and solid right middle lobe nodule were stable from 06/07/2018. Follow-up CT chest without contrast in 1 year was recommended as adenocarcinoma could not be excluded. There was a right adrenal adenoma. There was aortic atherosclerosis, coronary artery calcification, emphysema   09/19/2019:  Chest CT without contrast revealed a part solid irregular nodule in the apical right upper lobe that appeared progressive from 09/20/2018, with an enlarging solid component. Findings were worrisome for primary bronchogenic carcinoma. There was a new 4 mm medial segment right middle lobe nodule. The 7 mm lateral segment right middle lobe nodule was stable. Continued attention on follow-up exams was warranted. There was new borderline left axillary adenopathy. Left subpectoral lymph nodes were subcentimeter in size but new from 09/20/2018. There was question mild basilar pulmonary fibrosis. There was right adrenal adenoma. There was aortic atherosclerosis, coronary artery calcification, and emphysema  11/03/2019:  PET scan revealed that the right upper lobe and right middle lobe pulmonary nodules did not demonstrate any definite hypermetabolism but both were still worrisome and should be followed closely. Recommended follow-up noncontrast chest CT in 3-6 months. There were no enlarged or hypermetabolic mediastinal or hilar lymph nodes. There were stable emphysematous changes and atherosclerotic vascular disease. 12/01/2019:  Super D Chest CT without contrast revealed no significant change in three RIGHT lung pulmonary nodules compared to CT 11/19/2019. There was a RIGHT adrenal adenoma. 04/23/2020:  Chest CT without contrast revealed 3 growing scattered spiculated irregular solid pulmonary nodules in the right lung suspicious for synchronous primary bronchogenic carcinomas, measuring 1.1 cm in the peripheral apical right upper lobe, 1.0 cm in the peripheral right middle lobe, and 0.9 cm in the medial right middle lobe. Multidisciplinary thoracic oncology consultation was suggested. There was no thoracic adenopathy. There was one vessel coronary atherosclerosis. There was stable right adrenal adenoma. There was aortic atherosclerosis and emphysema. 08/21/2020:  PET scan showed 3 slow growing right  lung pulmonary nodules. Two of these nodules had an SUV very similar to the 11/03/2019 exam, exhibiting low-grade activity.  Right upper lobe pulmonary nodule measured 1.2 x 0.6 centimeters (SUV 2.3), right middle lobe cephalad nodule measured 1.1 x 0.9  cm (SUV 1.0), and right middle lobe caudal/anterior nodule measured 1.1 x 0.9 cm (SUV 3.1).  Right middle lobe lesion closest to the diaphragm exhibited increased activity, maximum SUV 3.1 (formerly 1.3) as compared to prior PET scan. Proximity to the diaphragm can cause motion artifact which can reduce accuracy of SUV readings. 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 Other imaging findings of potential clinical significance: aortic atherosclerosis and emphysema, coronary atherosclerosis, mild cardiomegaly, a small right adrenal adenoma, fascial defect and small hernia along the left lateral abdominal wall musculature near the iliac crest, and thoracic and lumbar spondylosis. There was egenerative glenohumeral arthropathy (L>R). There was a small and likely clinically inconsequential suspected meningioma anteriorly along the left middle cranial fossa.   Hospital Outpatient Visit on 08/21/2020  Component Date Value Ref Range Status  . Glucose-Capillary 08/21/2020 101* 70 - 99 mg/dL Final   Glucose reference range applies only to samples taken after fasting for at least 8 hours.    Assessment:  PAVIELLE BIGGAR is a 80 y.o. female with three enlarging right pulmonary nodules s/p video bronchoscopy with endobronchial ultrasound on 12/05/2019.  There was friable mucosa at the medial segment of the RML. Right middle lobe brushings and biopsy showed benign lung tissue. The sample was nondiagnostic.   PET scan on 08/21/2020 showed 3 slow growing right lung pulmonary nodules. Two of these nodules had an SUV very similar to the 11/03/2019 exam, exhibiting  low-grade activity.  Right upper lobe pulmonary nodule measured 1.2 x 0.6 centimeters (SUV 2.3), right middle lobe cephalad nodule measured 1.1 x 0.9 cm (SUV 1.0), and right middle lobe caudal/anterior nodule measured 1.1 x 0.9 cm (SUV 3.1).  Right middle lobe lesion closest to the diaphragm exhibited increased activity, maximum SUV 3.1 (formerly 1.3) as compared to prior PET scan.  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  She has a history of cervical cancer in 1975 and is s/p hysterectomy. She is unsure of the staging. She has also had skin cancer; she does not think it was melanoma.  The patient received a Pfizer COVID-19 vaccine on 06/19/2020.  Symptomatically, she feels "good".  She has a cough and shortness of breath on exertion.  Exam is unremarkable.  Plan: 1.   Pulmonary nodules  Review imaging studies dating back to 09/2018.  Super D Chest CT on 12/01/2019 revealed no significant change in three RIGHT lung pulmonary nodules.  Video bronchoscopy with endobronchial ultrasound on 12/05/2019 was non-diagnostic.  PET scan on 08/21/2020 revealed three slowly growing pulmonary nodules.   RUL nodule 1.2 cm (SUV 2.3).  RML nodule 1.1 cm (SUV 1.0) and second RML nodule 1.1 cm (SUV 3.1).   RML nodule closest to the diaphragm demonstrated increased activity (SUV 1.3 to 3.1) compared to PET scan from 11/03/2019.  Suspect slow growing adenocarcinomas.  Present at tumor board.   Unclear if lesion amendable to repeat biopsy.  Contact Dr Lanney Gins.  Several questions asked and answered. 2.   Tumor board on 08/30/2020. 3.   MD to call patient after tumor board. 4.   RTC based on above (TBD).  I discussed the assessment and treatment plan with the patient.  The patient was provided an opportunity to ask questions and all were answered.  The patient agreed with the plan and demonstrated an  understanding of the  instructions.  The patient was advised to call back if the symptoms worsen or if the condition fails to improve as anticipated.  I provided 33 minutes of face-to-face time during this this encounter and > 50% was spent counseling as documented under my assessment and plan. An additional 15 minutes were spent reviewing her chart (Epic and Care Everywhere) including notes, labs, and imaging studies.    Zahari Xiang C. Mike Gip, MD, PhD    08/23/2020, 3:09 PM  I, Mirian Mo Tufford, am acting as Education administrator for [employer]. Mike Gip, MD, PhD.  I, Shammond Arave C. Mike Gip, MD, have reviewed the above documentation for accuracy and completeness, and I agree with the above.

## 2020-08-23 ENCOUNTER — Encounter: Payer: Self-pay | Admitting: Hematology and Oncology

## 2020-08-23 ENCOUNTER — Other Ambulatory Visit: Payer: Self-pay

## 2020-08-23 ENCOUNTER — Inpatient Hospital Stay: Payer: PPO | Attending: Hematology and Oncology | Admitting: Hematology and Oncology

## 2020-08-23 ENCOUNTER — Other Ambulatory Visit: Payer: PPO

## 2020-08-23 VITALS — BP 128/70 | HR 87 | Temp 97.0°F | Resp 16 | Wt 182.8 lb

## 2020-08-23 DIAGNOSIS — R918 Other nonspecific abnormal finding of lung field: Secondary | ICD-10-CM | POA: Insufficient documentation

## 2020-08-23 NOTE — Progress Notes (Signed)
Patient here for initial oncology appointment, expresses no complaints or concerns at this time.

## 2020-09-05 ENCOUNTER — Inpatient Hospital Stay: Payer: PPO

## 2020-09-07 DIAGNOSIS — G4733 Obstructive sleep apnea (adult) (pediatric): Secondary | ICD-10-CM | POA: Diagnosis not present

## 2020-09-20 ENCOUNTER — Other Ambulatory Visit: Payer: PPO

## 2020-09-20 NOTE — Progress Notes (Signed)
Tumor Board Documentation  [place] was presented by Dr Mike Gip at our Tumor Board on 09/20/2020, which included representatives from medical oncology,radiation oncology,internal medicine,navigation,pathology,radiology,surgical,pharmacy,genetics,research,palliative care,pulmonology.  [place] currently presents as a new patient,for discussion with history of the following treatments: active survellience.  Additionally, we reviewed previous medical and familial history, history of present illness, and recent lab results along with all available histopathologic and imaging studies. The tumor board considered available treatment options and made the following recommendations: Active surveillance,Biopsy (Dr Patsey Berthold will speak with Dr Sanjuana Mae about doing a robotic assited Brochoscopic biopsy at the end of April)    The following procedures/referrals were also placed: No orders of the defined types were placed in this encounter.   Clinical Trial Status: not discussed   Staging used: Not Applicable  National site-specific guidelines   were discussed with respect to the case.  Tumor board is a meeting of clinicians from various specialty areas who evaluate and discuss patients for whom a multidisciplinary approach is being considered. Final determinations in the plan of care are those of the provider(s). The responsibility for follow up of recommendations given during tumor board is that of the provider.   Today's extended care, comprehensive team conference, [place] was not present for the discussion and was not examined.   Multidisciplinary Tumor Board is a multidisciplinary case peer review process.  Decisions discussed in the Multidisciplinary Tumor Board reflect the opinions of the specialists present at the conference without having examined the patient.  Ultimately, treatment and diagnostic decisions rest with the primary provider(s) and the patient.

## 2020-10-03 ENCOUNTER — Inpatient Hospital Stay: Payer: PPO

## 2020-10-04 ENCOUNTER — Telehealth: Payer: Self-pay | Admitting: Pulmonary Disease

## 2020-10-04 ENCOUNTER — Other Ambulatory Visit: Payer: Self-pay | Admitting: Hematology and Oncology

## 2020-10-04 DIAGNOSIS — R918 Other nonspecific abnormal finding of lung field: Secondary | ICD-10-CM

## 2020-10-04 NOTE — Telephone Encounter (Signed)
Lm to offer consult visit for 10/11/2020 at 2:00 to discuss bronch.

## 2020-10-05 ENCOUNTER — Encounter: Payer: Self-pay | Admitting: *Deleted

## 2020-10-05 NOTE — Telephone Encounter (Signed)
Lm x2 for patient.  Will call once more due to nature of call.

## 2020-10-05 NOTE — Telephone Encounter (Signed)
appt scheduled for 10/11/2020 at 2:00. Patient is aware and voiced her understanding.  Nothing further needed at this time.

## 2020-10-05 NOTE — Progress Notes (Signed)
  Oncology Nurse Navigator Documentation  Navigator Location: [facility] (10/05/20 1100)   )Navigator Encounter Type: Telephone (10/05/20 1100) Telephone: Outgoing Call (10/05/20 1100) Abnormal Finding Date: 08/22/20 (10/05/20 1100)                   Treatment Phase: Abnormal Scans (10/05/20 1100) Barriers/Navigation Needs: Coordination of Care (10/05/20 1100)   Interventions: Coordination of Care;Referrals (10/05/20 1100) Referrals: Pulmonary (10/05/20 1100) Coordination of Care: Appts;Broncoscopy (10/05/20 1100)        Acuity: Level 2-Minimal Needs (1-2 Barriers Identified) (10/05/20 1100)      spoke with patient regarding PET scan results and referral to Dr. Patsey Berthold to consider bronchoscopy. Pt verbalized understanding and in agreement with proceeding with referral. Pt informed that she will follow up with Dr. Mike Gip after her bronchoscopy with Dr. Patsey Berthold. Pt verbalized understanding. Nothing further needed at this time.   Time Spent with Patient: 30 (10/05/20 1100)

## 2020-10-05 NOTE — Telephone Encounter (Signed)
Spoke to patient's daughter, Lori(EC) and requested that she contact patient and have her call our office.

## 2020-10-11 ENCOUNTER — Ambulatory Visit: Payer: PPO | Admitting: Pulmonary Disease

## 2020-10-11 ENCOUNTER — Other Ambulatory Visit: Payer: Self-pay

## 2020-10-11 ENCOUNTER — Encounter: Payer: Self-pay | Admitting: Pulmonary Disease

## 2020-10-11 ENCOUNTER — Telehealth: Payer: Self-pay | Admitting: Pulmonary Disease

## 2020-10-11 VITALS — BP 130/72 | HR 97 | Temp 97.0°F | Ht 59.0 in | Wt 184.0 lb

## 2020-10-11 DIAGNOSIS — R918 Other nonspecific abnormal finding of lung field: Secondary | ICD-10-CM

## 2020-10-11 DIAGNOSIS — J449 Chronic obstructive pulmonary disease, unspecified: Secondary | ICD-10-CM

## 2020-10-11 DIAGNOSIS — G4733 Obstructive sleep apnea (adult) (pediatric): Secondary | ICD-10-CM

## 2020-10-11 DIAGNOSIS — Z9989 Dependence on other enabling machines and devices: Secondary | ICD-10-CM | POA: Diagnosis not present

## 2020-10-11 DIAGNOSIS — K219 Gastro-esophageal reflux disease without esophagitis: Secondary | ICD-10-CM

## 2020-10-11 MED ORDER — STIOLTO RESPIMAT 2.5-2.5 MCG/ACT IN AERS: 2.0000 | INHALATION_SPRAY | Freq: Every day | RESPIRATORY_TRACT | 0 refills | Status: AC

## 2020-10-11 NOTE — Patient Instructions (Signed)
We are giving you a trial of Stiolto 2 puffs once a day.  This is basically Spiriva with an extra ingredient.  DO NOT TAKE SPIRIVA WHILE TAKING THE STIOLTO.  Let us know if the Stiolto works better for you.  We are going to schedule a CT scan of the chest a week or 2 before your procedure.  Your procedure is tentatively scheduled for 27 April time to be determined.  You will be getting instructions closer to the time.

## 2020-10-11 NOTE — Progress Notes (Signed)
Subjective:    Patient ID: Lisa Cowan, female    DOB: [birth year], 80 y.o.   MRN: [id number]  HPI This is a 80 year old female, remote former smoker (40-pack-year history), with  enlarging pulmonary nodules on the right lung who is kindly referred in consultation by Dr. Nolon Stalls.  The patient follows usually with Dr. Raul Del and Dr. Lanney Gins.  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  They are as detailed below.  She underwent bronchoscopy with ENB in May 2021.  This bronchoscopy was nondiagnostic.  The lesions are quite challenging in location and may be more amenable to diagnosis by robotic bronchoscopy.  The patient was presented at tumor board and the decision was to try to obtain more tissue.  The patient is however not an optimal surgical candidate.  She has however tolerated general anesthesia in the past for the EMB.  I discussed the case with Dr. Lanney Gins who agreed that robotic bronchoscopy may yield diagnosis.  This was discussed with the patient.  The patient presents today with her daughter for this consultation.  Is occasional cough only when she gets hot.  She also notices shortness of breath for which she has been on Spiriva.  She has not noted significant improvement on the Spiriva.  Not had any fevers, chills or sweats.  No weight loss or anorexia.  No orthopnea or paroxysmal nocturnal dyspnea.  Most recent pulmonary function testing was performed in April 2021 this showed an FEV1 of 1.52 L or 88% predicted, FVC 2.02 L or 92% predicted FEV1/FVC was 75% and FEF 25-75% was 80%.  Diffusion capacity was mildly to moderately reduced and flow volume loop was described as delayed.  This is consistent with mild obstruction.  She describes a "rash" on her abdomen for approximately 2 years.  Patient does not endorse any other symptoms or concerns.  She has had no occupational exposures.  She did reside in  [place] previously.  Data: 06/07/2018:  Chest CT angiogram revealed a 7 mm pulmonary nodule in the right middle lobe. There was a 7 mm scar or sub solid nodule in the right upper lobe. Follow-up non-contrast CT was recommended at 3-6 months to confirm persistence. There was aortic atherosclerosis, coronary artery calcification, and emphysema. 09/20/2018:  Chest CT without contrast revealed that the sub solid right upper lobe nodule and solid right middle lobe nodule were stable from 06/07/2018. Follow-up CT chest without contrast in 1 year was recommended as adenocarcinoma could not be excluded. There was a right adrenal adenoma. There was aortic atherosclerosis, coronary artery calcification, emphysema  09/19/2019:  Chest CT without contrast revealed a part solid irregular nodule in the apical right upper lobe that appeared progressive from 09/20/2018, with an enlarging solid component. Findings were worrisome for primary bronchogenic carcinoma. There was a new 4 mm medial segment right middle lobe nodule. The 7 mm lateral segment right middle lobe nodule was stable. Continued attention on follow-up exams was warranted. There was new borderline left axillary adenopathy. Left subpectoral lymph nodes were subcentimeter in size but new from 09/20/2018. There was question mild basilar pulmonary fibrosis. There was right adrenal adenoma. There was aortic atherosclerosis, coronary artery calcification, and emphysema  11/03/2019:  PET scan revealed that the right upper lobe and right middle lobe pulmonary nodules did not demonstrate any definite hypermetabolism but both were still worrisome and should be followed closely. Recommended follow-up noncontrast chest CT in 3-6 months.  There were no enlarged or hypermetabolic mediastinal or hilar lymph nodes. There were stable emphysematous changes and atherosclerotic vascular disease. 12/01/2019:  Super D Chest CT without contrast revealed no significant change in  three RIGHT lung pulmonary nodules compared to CT 11/19/2019. There was a RIGHT adrenal adenoma. 04/23/2020:  Chest CT without contrast revealed 3 growing scattered spiculated irregular solid pulmonary nodules in the right lung suspicious for synchronous primary bronchogenic carcinomas, measuring 1.1 cm in the peripheral apical right upper lobe, 1.0 cm in the peripheral right middle lobe, and 0.9 cm in the medial right middle lobe. Multidisciplinary thoracic oncology consultation was suggested. There was no thoracic adenopathy. There was one vessel coronary atherosclerosis. There was stable right adrenal adenoma. There was aortic atherosclerosis and emphysema. 08/21/2020:  PET scan showed 3 slow growing right lung pulmonary nodules. Two of these nodules had an SUV very similar to the 11/03/2019 exam, exhibiting low-grade activity.  Right upper lobe pulmonary nodule measured 1.2 x 0.6 centimeters (SUV 2.3), right middle lobe cephalad nodule measured 1.1 x 0.9 cm (SUV 1.0), and right middle lobe caudal/anterior nodule measured 1.1 x 0.9 cm (SUV 3.1).  Right middle lobe lesion closest to the diaphragm exhibited increased activity, maximum SUV 3.1 (formerly 1.3) as compared to prior PET scan. Proximity to the diaphragm can cause motion artifact which can reduce accuracy of SUV readings. 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 Other imaging findings of potential clinical significance: aortic atherosclerosis and emphysema, coronary atherosclerosis, mild cardiomegaly, a small right adrenal adenoma, fascial defect and small hernia along the left lateral abdominal wall musculature near the iliac crest, and thoracic and lumbar spondylosis. There was egenerative glenohumeral arthropathy (L>R). There was a small and likely clinically inconsequential suspected meningioma anteriorly along the left middle cranial  fossa.  Review of Systems A 10 point review of systems was performed and it is as noted above otherwise negative.  Past Medical History:  Diagnosis Date  . Adrenal nodule ([place])   . Anemia   . Anginal pain ([place])   . Arthritis   . Cancer ([place])    cervical ca  . Complication of anesthesia   . COPD (chronic obstructive pulmonary disease) ([place])   . Depression   . Dyspnea    with exertion  . GERD (gastroesophageal reflux disease)   . Headache    h/o migraines  . Hyperlipidemia   . Hypertension   . PONV (postoperative nausea and vomiting)    x1 and no problems since  . Skin cancer   . Sleep apnea    CPAP   Past Surgical History:  Procedure Laterality Date  . ABDOMINAL HYSTERECTOMY    . ADENOIDECTOMY    . BREAST BIOPSY Right 01/04/2016   stereo  . CARDIAC CATHETERIZATION Left 08/21/2016   Procedure: Left Heart Cath and Coronary Angiography;  Surgeon: Isaias Cowman, MD;  Location: [facility];  Service: Cardiovascular;  Laterality: Left;  . CATARACT EXTRACTION W/PHACO Right 05/18/2019   Procedure: CATARACT EXTRACTION PHACO AND INTRAOCULAR LENS PLACEMENT (IOC) Right  00:52.2  18.7%  9.59;  Surgeon: Leandrew Koyanagi, MD;  Location: [facility];  Service: Ophthalmology;  Laterality: Right;  sleep apnea  . CATARACT EXTRACTION W/PHACO Left 06/08/2019   Procedure: CATARACT EXTRACTION PHACO AND INTRAOCULAR LENS PLACEMENT (IOC) LEFT 9.60,    01:08.5,    14.0%;  Surgeon: Leandrew Koyanagi, MD;  Location: [facility];  Service: Ophthalmology;  Laterality: Left;  . COLONOSCOPY WITH PROPOFOL N/A 08/07/2017   Procedure: COLONOSCOPY WITH PROPOFOL;  Surgeon: Manya Silvas, MD;  Location[facility];  Service: Endoscopy;  Laterality: N/A;  . ESOPHAGOGASTRODUODENOSCOPY (EGD) WITH PROPOFOL N/A 08/07/2017   Procedure: ESOPHAGOGASTRODUODENOSCOPY (EGD) WITH PROPOFOL;  Surgeon: Manya Silvas, MD;  Location: [facility] ENDOSCOPY;  Service: Endoscopy;  Laterality:  N/A;  . FOOT SURGERY     multiple  . FRACTURE SURGERY     great toe fx  . ROTATOR CUFF REPAIR Left   . TONSILLECTOMY    . VIDEO BRONCHOSCOPY WITH ENDOBRONCHIAL NAVIGATION N/A 12/05/2019   Procedure: VIDEO BRONCHOSCOPY WITH ENDOBRONCHIAL NAVIGATION;  Surgeon: Ottie Glazier, MD;  Location: [facility];  Service: Thoracic;  Laterality: N/A;  . VIDEO BRONCHOSCOPY WITH ENDOBRONCHIAL ULTRASOUND N/A 12/05/2019   Procedure: VIDEO BRONCHOSCOPY WITH ENDOBRONCHIAL ULTRASOUND;  Surgeon: Ottie Glazier, MD;  Location: [facility];  Service: Thoracic;  Laterality: N/A;   Family History  Problem Relation Age of Onset  . Breast cancer Sister 38   Social History   Tobacco Use  . Smoking status: Former Smoker    Packs/day: 1.00    Years: 30.00    Pack years: 30.00    Types: Cigarettes    Quit date: 2000    Years since quitting: 22.2  . Smokeless tobacco: Never Used  Substance Use Topics  . Alcohol use: Yes    Comment: occassionally   Allergies  Allergen Reactions  . Codeine Itching and Other (See Comments)    hallucinations  . Hydrocodone-Acetaminophen Itching and Other (See Comments)    hallucinations  . Nucynta [Tapentadol] Itching and Other (See Comments)    hallucinations  . Oxycodone-Acetaminophen Itching and Other (See Comments)    hallucinations  . Statins     Myalgia when taken daily  . Adhesive [Tape] Rash   Current Meds  Medication Sig  . atenolol (TENORMIN) 25 MG tablet Take 25 mg by mouth every morning.   . Cyanocobalamin (VITAMIN B-12) 5000 MCG TBDP Take 5,000 mcg by mouth daily.  Marland Kitchen FLUoxetine (PROZAC) 20 MG capsule Take 20 mg by mouth every morning.   . gabapentin (NEURONTIN) 300 MG capsule Take 300 mg by mouth 3 (three) times daily as needed (back pain.).   Marland Kitchen ipratropium (ATROVENT) 0.03 % nasal spray Place 2 sprays into both nostrils daily.  Marland Kitchen losartan (COZAAR) 50 MG tablet Take 50 mg by mouth every morning.   . meclizine (ANTIVERT) 12.5 MG tablet Take 1 tablet (12.5 mg  total) by mouth 2 (two) times daily as needed for dizziness.  . meloxicam (MOBIC) 7.5 MG tablet Take 7.5 mg by mouth daily.  . methocarbamol (ROBAXIN) 500 MG tablet Take 500 mg by mouth 3 (three) times daily as needed for spasms.  . Multiple Vitamin (MULTIVITAMIN WITH MINERALS) TABS tablet Take 1 tablet by mouth every other day.   . nystatin cream (MYCOSTATIN) Apply 1 application topically 2 (two) times daily. Sore under right breast  . omeprazole (PRILOSEC) 20 MG capsule Take 20 mg by mouth 2 (two) times daily before a meal.  . rosuvastatin (CRESTOR) 5 MG tablet Take 5 mg by mouth every Monday, Wednesday, and Friday.   Marland Kitchen SPIRIVA RESPIMAT 2.5 MCG/ACT AERS Inhale 1 puff into the lungs daily.  . SYSTANE ULTRA 0.4-0.3 % SOLN Place 1 drop into both eyes daily.  Marland Kitchen triamterene-hydrochlorothiazide (DYAZIDE) 37.5-25 MG capsule Take 1 capsule by mouth every morning.    Immunization History  Administered Date(s) Administered  . Fluad Quad(high  Dose 65+) 05/19/2019  . Influenza, High Dose Seasonal PF 04/12/2014  . Influenza-Unspecified 03/15/2020  . PFIZER(Purple Top)SARS-COV-2 Vaccination 08/04/2019, 08/25/2019, 06/19/2020  . Pneumococcal Conjugate-13 07/08/2016  . Pneumococcal Polysaccharide-23 06/23/2012      Objective:   Physical Exam BP 130/72 (BP Location: Left Arm, Cuff Size: Normal)   Pulse 97   Temp (!) 97 F (36.1 C) (Temporal)   Ht 4\' 11"  (1.499 m)   Wt 184 lb (83.5 kg)   SpO2 92%   BMI 37.16 kg/m  GENERAL: Somewhat overweight woman, no acute distress.  Fully ambulatory.  No conversational dyspnea.   HEAD: Normocephalic, atraumatic.  EYES: Pupils equal, round, reactive to light.  No scleral icterus.  MOUTH: Nose/mouth/throat not examined due to masking requirements for COVID 19. NECK: Supple. No thyromegaly. Trachea midline. No JVD.  No adenopathy. PULMONARY: Symmetrical air entry.  Coarse breath sounds otherwise no adventitious sounds. CARDIOVASCULAR: S1 and S2. Regular rate  and rhythm.  Normal ABDOMEN: MUSCULOSKELETAL: No joint deformity, no clubbing, no edema.  NEUROLOGIC:  SKIN: Intact,warm,dry. PSYCH:  Representative slices from CT performed 23 April 2020:             Assessment & Plan:     ICD-10-CM   1. Pulmonary nodules  R91.8    Attempt at diagnosis with robotic bronchoscopy 27 April Patient not a candidate for surgical approach Concerning for  primary lung CA vs fungal infection   2. Chronic obstructive pulmonary disease, unspecified COPD type ()  J44.9 CT Super D Chest Wo Contrast   Continued issues with dyspnea on Spiriva Trial of Stiolto, samples provided Patient to let us know if Stiolto helps   3. OSA on CPAP  G47.33    Z99.89    This issue adds complexity to her management Has been well compensated in this regard  4. Gastroesophageal reflux disease, unspecified whether esophagitis present  K21.9    This issue adds complexity to her management    Meds ordered this encounter  Medications  . Tiotropium Bromide-Olodaterol (STIOLTO RESPIMAT) 2.5-2.5 MCG/ACT AERS    Sig: Inhale 2 puffs into the lungs daily for 1 day.    Dispense:  4 g    Refill:  0    Order Specific Question:   Lot Number?    Answer:   [id number]    Order Specific Question:   Expiration Date?    Answer:   02/18/2022    Order Specific Question:   Quantity    Answer:   1   Orders Placed This Encounter  Procedures  . CT Super D Chest Wo Contrast    Scheduling Instructions:     Around 11/05/2020    Order Specific Question:   Preferred imaging location?    Answer:   [facility]   Discussion:  Patient has 3 growing scattered spiculated irregular pulmonary nodules in the right lung suspicious for synchronous primary bronchogenic carcinomas.  Other possibilities include chronic coccidiomycosis (though this is less likely) given that she has lived previously in [place].  She did not have classic symptoms of acute disease.  She has undergone navigational  bronchoscopy in May 2021 which did not yield results.  Presented to the tumor board again and Dr. Mike Gip, her oncologist, has referred her for potential robotic bronchoscopy.  I discussed the case with Dr. Lanney Gins who is in agreement.  Nodules are concerning for slow-growing adenocarcinoma versus slow-growing fungal infection as noted above.  We will continue to follow for the issues around these nodules and  then rerefer back to her primary pulmonary group once diagnosis is made.  Some issues with dyspnea we will give her a short trial of Stiolto to see if she prefers this to the Spiriva.  She will have a super dimension CT prior to robotic procedure.  Renold Don, MD [facility]   *This note was dictated using voice recognition software/Dragon.  Despite best efforts to proofread, errors can occur which can change the meaning.  Any change was purely unintentional.

## 2020-10-11 NOTE — Telephone Encounter (Signed)
ATC to schedule Bronchoscopy with navigation and cellvizio for 4/27/202. I was advised by [facility] with centralized scheduling that back to back bronchs could not be scheduled.  Dr. Patsey Berthold was under the impression that the whole day of 10/14/2020 would be open for Robotic. Lm for Shireen Quan with cardiopulmonary to confirm.

## 2020-10-15 NOTE — Telephone Encounter (Signed)
Per Dr. Patsey Berthold verbally- schedule for 7:30a on 11/14/2020. I spoke to stephanie with centralized scheduling and was advised that 7:30 slot is not available.   ATC Shireen Quan with cardiopulmonary. Unable to leave vm due to mailbox being full.

## 2020-10-17 NOTE — Telephone Encounter (Signed)
Bronchoscopy with navigation and cellivizo scheduled for 11/15/2019 at 7:30a. BV:APOL nodule IDC:[id number]  Rodena Piety, please see bronch info. Thanks

## 2020-10-18 NOTE — Telephone Encounter (Signed)
I have spoke with Gerald Stabs with [facility] and Prior Josem Kaufmann is not required for codes [phone], [phone] Refer # Randa Ngo 10/18/2020

## 2020-10-19 NOTE — Telephone Encounter (Signed)
Phone pre admit visit 11/02/2020 between 1-5 and covid test 11/12/2020 at 8:45.  Patient is aware of dates/times. She voiced her understanding and had no further questions.  Nothing further needed at this time.

## 2020-10-24 ENCOUNTER — Other Ambulatory Visit: Payer: Self-pay

## 2020-10-24 ENCOUNTER — Inpatient Hospital Stay: Payer: PPO | Attending: Hematology and Oncology | Admitting: Hospice and Palliative Medicine

## 2020-10-24 DIAGNOSIS — R918 Other nonspecific abnormal finding of lung field: Secondary | ICD-10-CM

## 2020-10-24 NOTE — Progress Notes (Signed)
Multidisciplinary Oncology Council Documentation  Lisa Cowan was presented by our [facility] on 10/24/2020, which included representatives from:  . Palliative Care . Dietitian  . Physical/Occupational Therapist . Nurse Navigator . Genetics . Speech Therapist . Social work . Survivorship RN . Hotel manager . Research RN . Lisa Cowan currently presents with history of pulmonary nodules  We reviewed previous medical and familial history, history of present illness, and recent lab results along with all available histopathologic and imaging studies. The [place] considered available treatment options and made the following recommendations/referrals:  Consider research, navigator already following  The MOC is a meeting of clinicians from various specialty areas who evaluate and discuss patients for whom a multidisciplinary approach is being considered. Final determinations in the plan of care are those of the provider(s).   Today's extended care, comprehensive team conference, Lisa Cowan was not present for the discussion and was not examined.

## 2020-10-26 ENCOUNTER — Telehealth: Payer: Self-pay | Admitting: Pulmonary Disease

## 2020-10-26 MED ORDER — STIOLTO RESPIMAT 2.5-2.5 MCG/ACT IN AERS: 2.0000 | INHALATION_SPRAY | Freq: Every day | RESPIRATORY_TRACT | 2 refills | Status: DC

## 2020-10-26 NOTE — Telephone Encounter (Signed)
Rx for stiolto has been sent to preferred pharmacy, as patient feels that this medication is effective.  Nothing further needed at this time.

## 2020-10-26 NOTE — Telephone Encounter (Signed)
Received alternative request for stiolto from [facility].  I have spoken to patient and requested that she contact insurance for a list of covered alternatives.  Patient will call back with update.

## 2020-10-30 ENCOUNTER — Ambulatory Visit
Admission: RE | Admit: 2020-10-30 | Discharge: 2020-10-30 | Disposition: A | Discharge status: Home / Self Care | Payer: PPO | Source: Ambulatory Visit | Attending: Pulmonary Disease | Admitting: Pulmonary Disease

## 2020-10-30 ENCOUNTER — Other Ambulatory Visit: Payer: Self-pay

## 2020-10-30 DIAGNOSIS — J432 Centrilobular emphysema: Secondary | ICD-10-CM | POA: Diagnosis not present

## 2020-10-30 DIAGNOSIS — I2510 Atherosclerotic heart disease of native coronary artery without angina pectoris: Secondary | ICD-10-CM | POA: Diagnosis not present

## 2020-10-30 DIAGNOSIS — I700 Atherosclerosis of aorta: Secondary | ICD-10-CM | POA: Diagnosis not present

## 2020-10-30 DIAGNOSIS — J929 Pleural plaque without asbestos: Secondary | ICD-10-CM | POA: Diagnosis not present

## 2020-10-30 DIAGNOSIS — J449 Chronic obstructive pulmonary disease, unspecified: Secondary | ICD-10-CM | POA: Insufficient documentation

## 2020-11-02 ENCOUNTER — Other Ambulatory Visit
Admission: RE | Admit: 2020-11-02 | Discharge: 2020-11-02 | Disposition: A | Discharge status: Home / Self Care | Payer: PPO | Source: Ambulatory Visit | Attending: Pulmonary Disease | Admitting: Pulmonary Disease

## 2020-11-02 ENCOUNTER — Other Ambulatory Visit: Payer: Self-pay

## 2020-11-02 NOTE — Patient Instructions (Addendum)
Your procedure is scheduled on: 11/14/20 - Wednesday Report to the Registration Desk on the 1st floor of the [facility]. To find out your arrival time, please call [phone] between 1PM - 3PM on: 11/13/20 - Tuesday Report to [facility] for Labs/EKG 11/12/20 0845 am  REMEMBER: Instructions that are not followed completely may result in serious medical risk, up to and including death; or upon the discretion of your surgeon and anesthesiologist your surgery may need to be rescheduled.  Do not eat food or drink any fluids after midnight the night before surgery.  No gum chewing, lozengers or hard candies.   TAKE THESE MEDICATIONS THE MORNING OF SURGERY WITH A SIP OF WATER:  - atenolol (TENORMIN) 25 MG tablet - FLUoxetine (PROZAC) 20 MG capsule - ipratropium (ATROVENT) 0.03 % nasal spray - omeprazole (PRILOSEC) 20 MG capsule, take one the night before and one on the morning of surgery - helps to prevent nausea after surgery. - rosuvastatin (CRESTOR) 5 MG tablet - SYSTANE ULTRA 0.4-0.3 % SOLN - tiotropium (SPIRIVA) 18 MCG inhalation capsule  Use inhalers albuterol (VENTOLIN HFA) 108 (90 Base) MCG/ACT inhaler on the day of surgery and bring to the hospital.  One week prior to surgery: Stop taking beginning 04 /19/22  : meloxicam (MOBIC) 7.5 MG tablet Stop Anti-inflammatories (NSAIDS) such as Advil, Aleve, Ibuprofen, Motrin, Naproxen, Naprosyn and Aspirin based products such as Excedrin, Goodys Powder, BC Powder.  Stop ANY OVER THE COUNTER supplements until after surgery.  No Alcohol for 24 hours before or after surgery.  No Smoking including e-cigarettes for 24 hours prior to surgery.  No chewable tobacco products for at least 6 hours prior to surgery.  No nicotine patches on the day of surgery.  Do not use any "recreational" drugs for at least a week prior to your surgery.  Please be advised that the combination of cocaine and anesthesia may have negative outcomes, up to and  including death. If you test positive for cocaine, your surgery will be cancelled.  On the morning of surgery brush your teeth with toothpaste and water, you may rinse your mouth with mouthwash if you wish. Do not swallow any toothpaste or mouthwash.  Do not wear jewelry, make-up, hairpins, clips or nail polish.  Do not wear lotions, powders, or perfumes.   Do not shave body from the neck down 48 hours prior to surgery just in case you cut yourself which could leave a site for infection.  Also, freshly shaved skin may become irritated if using the CHG soap.  Contact lenses, hearing aids and dentures may not be worn into surgery.  Do not bring valuables to the hospital. [facility] is not responsible for any missing/lost belongings or valuables.   Bring your C-PAP to the hospital with you in case you may have to spend the night.   Notify your doctor if there is any change in your medical condition (cold, fever, infection).  Wear comfortable clothing (specific to your surgery type) to the hospital.  Plan for stool softeners for home use; pain medications have a tendency to cause constipation. You can also help prevent constipation by eating foods high in fiber such as fruits and vegetables and drinking plenty of fluids as your diet allows.  After surgery, you can help prevent lung complications by doing breathing exercises.  Take deep breaths and cough every 1-2 hours. Your doctor may order a device called an Incentive Spirometer to help you take deep breaths. When coughing or sneezing,  hold a pillow firmly against your incision with both hands. This is called "splinting." Doing this helps protect your incision. It also decreases belly discomfort.  If you are being admitted to the hospital overnight, leave your suitcase in the car. After surgery it may be brought to your room.  If you are being discharged the day of surgery, you will not be allowed to drive home. You will need a  responsible adult (18 years or older) to drive you home and stay with you that night.   If you are taking public transportation, you will need to have a responsible adult (18 years or older) with you. Please confirm with your physician that it is acceptable to use public transportation.   Please call the [facility] at [phone] if you have any questions about these instructions.  Surgery Visitation Policy:  Patients undergoing a surgery or procedure may have one family member or support person with them as long as that person is not COVID-19 positive or experiencing its symptoms.  That person may remain in the waiting area during the procedure.  Inpatient Visitation:    Visiting hours are 7 a.m. to 8 p.m. Inpatients will be allowed two visitors daily. The visitors may change each day during the patient's stay. No visitors under the age of 15. Any visitor under the age of 41 must be accompanied by an adult. The visitor must pass COVID-19 screenings, use hand sanitizer when entering and exiting the patient's room and wear a mask at all times, including in the patient's room. Patients must also wear a mask when staff or their visitor are in the room. Masking is required regardless of vaccination status.

## 2020-11-12 ENCOUNTER — Other Ambulatory Visit: Payer: Self-pay

## 2020-11-12 ENCOUNTER — Other Ambulatory Visit
Admission: RE | Admit: 2020-11-12 | Discharge: 2020-11-12 | Disposition: A | Discharge status: Home / Self Care | Payer: PPO | Source: Ambulatory Visit | Attending: Pulmonary Disease | Admitting: Pulmonary Disease

## 2020-11-12 DIAGNOSIS — R918 Other nonspecific abnormal finding of lung field: Secondary | ICD-10-CM | POA: Diagnosis not present

## 2020-11-12 DIAGNOSIS — Z90710 Acquired absence of both cervix and uterus: Secondary | ICD-10-CM | POA: Diagnosis not present

## 2020-11-12 DIAGNOSIS — Z9841 Cataract extraction status, right eye: Secondary | ICD-10-CM | POA: Diagnosis not present

## 2020-11-12 DIAGNOSIS — Z791 Long term (current) use of non-steroidal anti-inflammatories (NSAID): Secondary | ICD-10-CM | POA: Diagnosis not present

## 2020-11-12 DIAGNOSIS — I10 Essential (primary) hypertension: Secondary | ICD-10-CM | POA: Diagnosis not present

## 2020-11-12 DIAGNOSIS — Y848 Other medical procedures as the cause of abnormal reaction of the patient, or of later complication, without mention of misadventure at the time of the procedure: Secondary | ICD-10-CM | POA: Diagnosis present

## 2020-11-12 DIAGNOSIS — Z961 Presence of intraocular lens: Secondary | ICD-10-CM | POA: Diagnosis not present

## 2020-11-12 DIAGNOSIS — Z885 Allergy status to narcotic agent status: Secondary | ICD-10-CM | POA: Diagnosis not present

## 2020-11-12 DIAGNOSIS — G4733 Obstructive sleep apnea (adult) (pediatric): Secondary | ICD-10-CM | POA: Diagnosis not present

## 2020-11-12 DIAGNOSIS — Z9842 Cataract extraction status, left eye: Secondary | ICD-10-CM | POA: Diagnosis not present

## 2020-11-12 DIAGNOSIS — Z20822 Contact with and (suspected) exposure to covid-19: Secondary | ICD-10-CM | POA: Diagnosis not present

## 2020-11-12 DIAGNOSIS — Z87891 Personal history of nicotine dependence: Secondary | ICD-10-CM | POA: Diagnosis not present

## 2020-11-12 DIAGNOSIS — E785 Hyperlipidemia, unspecified: Secondary | ICD-10-CM | POA: Diagnosis not present

## 2020-11-12 DIAGNOSIS — J449 Chronic obstructive pulmonary disease, unspecified: Secondary | ICD-10-CM | POA: Diagnosis not present

## 2020-11-12 DIAGNOSIS — J9811 Atelectasis: Secondary | ICD-10-CM | POA: Diagnosis not present

## 2020-11-12 DIAGNOSIS — J939 Pneumothorax, unspecified: Secondary | ICD-10-CM | POA: Diagnosis not present

## 2020-11-12 DIAGNOSIS — Z79899 Other long term (current) drug therapy: Secondary | ICD-10-CM | POA: Diagnosis not present

## 2020-11-12 DIAGNOSIS — J95811 Postprocedural pneumothorax: Secondary | ICD-10-CM | POA: Diagnosis present

## 2020-11-12 DIAGNOSIS — Z4682 Encounter for fitting and adjustment of non-vascular catheter: Secondary | ICD-10-CM | POA: Diagnosis not present

## 2020-11-12 DIAGNOSIS — K219 Gastro-esophageal reflux disease without esophagitis: Secondary | ICD-10-CM | POA: Diagnosis not present

## 2020-11-12 DIAGNOSIS — Z888 Allergy status to other drugs, medicaments and biological substances status: Secondary | ICD-10-CM | POA: Diagnosis not present

## 2020-11-12 DIAGNOSIS — R911 Solitary pulmonary nodule: Secondary | ICD-10-CM | POA: Diagnosis not present

## 2020-11-12 DIAGNOSIS — Z01810 Encounter for preprocedural cardiovascular examination: Secondary | ICD-10-CM | POA: Diagnosis not present

## 2020-11-12 DIAGNOSIS — Z9889 Other specified postprocedural states: Secondary | ICD-10-CM | POA: Diagnosis not present

## 2020-11-12 LAB — CBC
HCT: 36 % (ref 36.0–46.0)
Hemoglobin: 11.7 g/dL — ABNORMAL LOW (ref 12.0–15.0)
MCH: 26.5 pg (ref 26.0–34.0)
MCHC: 32.5 g/dL (ref 30.0–36.0)
MCV: 81.6 fL (ref 80.0–100.0)
Platelets: 241 10*3/uL (ref 150–400)
RBC: 4.41 MIL/uL (ref 3.87–5.11)
RDW: 15.4 % (ref 11.5–15.5)
WBC: 7.1 10*3/uL (ref 4.0–10.5)
nRBC: 0 % (ref 0.0–0.2)

## 2020-11-12 LAB — BASIC METABOLIC PANEL
Anion gap: 11 (ref 5–15)
BUN: 10 mg/dL (ref 8–23)
CO2: 30 mmol/L (ref 22–32)
Calcium: 8.8 mg/dL — ABNORMAL LOW (ref 8.9–10.3)
Chloride: 92 mmol/L — ABNORMAL LOW (ref 98–111)
Creatinine, Ser: 0.73 mg/dL (ref 0.44–1.00)
GFR, Estimated: >60 mL/min (ref >60)
Glucose, Bld: 112 mg/dL — ABNORMAL HIGH (ref 70–99)
Potassium: 3.3 mmol/L — ABNORMAL LOW (ref 3.5–5.1)
Sodium: 133 mmol/L — ABNORMAL LOW (ref 135–145)

## 2020-11-12 LAB — SARS CORONAVIRUS 2 (TAT 6-24 HRS): SARS Coronavirus 2: NEGATIVE

## 2020-11-13 ENCOUNTER — Telehealth: Payer: Self-pay

## 2020-11-13 MED ORDER — LACTATED RINGERS IV SOLN: INTRAVENOUS | Status: DC

## 2020-11-13 MED ORDER — CHLORHEXIDINE GLUCONATE 0.12 % MT SOLN: 15.0000 mL | Freq: Once | OROMUCOSAL | Status: AC

## 2020-11-13 MED ORDER — SODIUM CHLORIDE 0.9 % IV SOLN: Freq: Once | INTRAVENOUS | Status: DC

## 2020-11-13 MED ORDER — ORAL CARE MOUTH RINSE: 15.0000 mL | Freq: Once | OROMUCOSAL | Status: AC

## 2020-11-13 NOTE — Telephone Encounter (Signed)
Per Dr. Patsey Berthold verbally due to robotic requiring maintenance, move bronch to 12:30 vs 7:30a.  Patient is aware and voiced her understanding.  Spoke to [place] in [place] and moved case.  Nothing further needed at this time.

## 2020-11-14 ENCOUNTER — Inpatient Hospital Stay: Payer: PPO

## 2020-11-14 ENCOUNTER — Ambulatory Visit: Payer: PPO | Admitting: Anesthesiology

## 2020-11-14 ENCOUNTER — Encounter: Payer: Self-pay | Admitting: Pulmonary Disease

## 2020-11-14 ENCOUNTER — Ambulatory Visit: Payer: PPO

## 2020-11-14 ENCOUNTER — Other Ambulatory Visit: Payer: Self-pay

## 2020-11-14 ENCOUNTER — Inpatient Hospital Stay
Admission: RE | Admit: 2020-11-14 | Discharge: 2020-11-15 | DRG: 168 | Disposition: A | Discharge status: Home / Self Care | Payer: PPO | Attending: Pulmonary Disease | Admitting: Pulmonary Disease

## 2020-11-14 ENCOUNTER — Encounter
Admission: RE | Disposition: A | Discharge status: Home / Self Care | Payer: Self-pay | Source: Home / Self Care | Attending: Pulmonary Disease

## 2020-11-14 DIAGNOSIS — Z9841 Cataract extraction status, right eye: Secondary | ICD-10-CM | POA: Diagnosis not present

## 2020-11-14 DIAGNOSIS — J95811 Postprocedural pneumothorax: Principal | ICD-10-CM

## 2020-11-14 DIAGNOSIS — Z79899 Other long term (current) drug therapy: Secondary | ICD-10-CM | POA: Diagnosis not present

## 2020-11-14 DIAGNOSIS — Z20822 Contact with and (suspected) exposure to covid-19: Secondary | ICD-10-CM | POA: Diagnosis present

## 2020-11-14 DIAGNOSIS — Y848 Other medical procedures as the cause of abnormal reaction of the patient, or of later complication, without mention of misadventure at the time of the procedure: Secondary | ICD-10-CM | POA: Diagnosis present

## 2020-11-14 DIAGNOSIS — R911 Solitary pulmonary nodule: Secondary | ICD-10-CM | POA: Diagnosis not present

## 2020-11-14 DIAGNOSIS — Z888 Allergy status to other drugs, medicaments and biological substances status: Secondary | ICD-10-CM | POA: Diagnosis not present

## 2020-11-14 DIAGNOSIS — J449 Chronic obstructive pulmonary disease, unspecified: Secondary | ICD-10-CM | POA: Diagnosis present

## 2020-11-14 DIAGNOSIS — Z791 Long term (current) use of non-steroidal anti-inflammatories (NSAID): Secondary | ICD-10-CM | POA: Diagnosis not present

## 2020-11-14 DIAGNOSIS — Z90710 Acquired absence of both cervix and uterus: Secondary | ICD-10-CM

## 2020-11-14 DIAGNOSIS — K219 Gastro-esophageal reflux disease without esophagitis: Secondary | ICD-10-CM | POA: Diagnosis present

## 2020-11-14 DIAGNOSIS — E785 Hyperlipidemia, unspecified: Secondary | ICD-10-CM | POA: Diagnosis not present

## 2020-11-14 DIAGNOSIS — Z938 Other artificial opening status: Secondary | ICD-10-CM

## 2020-11-14 DIAGNOSIS — R918 Other nonspecific abnormal finding of lung field: Secondary | ICD-10-CM | POA: Diagnosis present

## 2020-11-14 DIAGNOSIS — I10 Essential (primary) hypertension: Secondary | ICD-10-CM | POA: Diagnosis present

## 2020-11-14 DIAGNOSIS — Z87891 Personal history of nicotine dependence: Secondary | ICD-10-CM | POA: Diagnosis not present

## 2020-11-14 DIAGNOSIS — Z9889 Other specified postprocedural states: Secondary | ICD-10-CM

## 2020-11-14 DIAGNOSIS — J939 Pneumothorax, unspecified: Secondary | ICD-10-CM

## 2020-11-14 DIAGNOSIS — Z9842 Cataract extraction status, left eye: Secondary | ICD-10-CM

## 2020-11-14 DIAGNOSIS — Z4682 Encounter for fitting and adjustment of non-vascular catheter: Secondary | ICD-10-CM | POA: Diagnosis not present

## 2020-11-14 DIAGNOSIS — Z885 Allergy status to narcotic agent status: Secondary | ICD-10-CM | POA: Diagnosis not present

## 2020-11-14 DIAGNOSIS — G4733 Obstructive sleep apnea (adult) (pediatric): Secondary | ICD-10-CM | POA: Diagnosis present

## 2020-11-14 DIAGNOSIS — Z961 Presence of intraocular lens: Secondary | ICD-10-CM | POA: Diagnosis present

## 2020-11-14 DIAGNOSIS — R0902 Hypoxemia: Secondary | ICD-10-CM

## 2020-11-14 DIAGNOSIS — J9811 Atelectasis: Secondary | ICD-10-CM | POA: Diagnosis not present

## 2020-11-14 HISTORY — PX: VIDEO BRONCHOSCOPY WITH ENDOBRONCHIAL NAVIGATION: SHX6175

## 2020-11-14 LAB — COMPREHENSIVE METABOLIC PANEL
ALT: 19 U/L (ref 0–44)
AST: 29 U/L (ref 15–41)
Albumin: 3.8 g/dL (ref 3.5–5.0)
Alkaline Phosphatase: 79 U/L (ref 38–126)
Anion gap: 10 (ref 5–15)
BUN: 10 mg/dL (ref 8–23)
CO2: 30 mmol/L (ref 22–32)
Calcium: 8.9 mg/dL (ref 8.9–10.3)
Chloride: 91 mmol/L — ABNORMAL LOW (ref 98–111)
Creatinine, Ser: 0.69 mg/dL (ref 0.44–1.00)
GFR, Estimated: >60 mL/min (ref >60)
Glucose, Bld: 171 mg/dL — ABNORMAL HIGH (ref 70–99)
Potassium: 3 mmol/L — ABNORMAL LOW (ref 3.5–5.1)
Sodium: 131 mmol/L — ABNORMAL LOW (ref 135–145)
Total Bilirubin: 0.5 mg/dL (ref 0.3–1.2)
Total Protein: 6.9 g/dL (ref 6.5–8.1)

## 2020-11-14 LAB — CBC WITH DIFFERENTIAL/PLATELET
Abs Immature Granulocytes: 0.03 10*3/uL (ref 0.00–0.07)
Basophils Absolute: 0 10*3/uL (ref 0.0–0.1)
Basophils Relative: 0 %
Eosinophils Absolute: 0 10*3/uL (ref 0.0–0.5)
Eosinophils Relative: 0 %
HCT: 35.4 % — ABNORMAL LOW (ref 36.0–46.0)
Hemoglobin: 11.4 g/dL — ABNORMAL LOW (ref 12.0–15.0)
Immature Granulocytes: 0 %
Lymphocytes Relative: 4 %
Lymphs Abs: 0.4 10*3/uL — ABNORMAL LOW (ref 0.7–4.0)
MCH: 26.1 pg (ref 26.0–34.0)
MCHC: 32.2 g/dL (ref 30.0–36.0)
MCV: 81.2 fL (ref 80.0–100.0)
Monocytes Absolute: 0.1 10*3/uL (ref 0.1–1.0)
Monocytes Relative: 1 %
Neutro Abs: 8.5 10*3/uL — ABNORMAL HIGH (ref 1.7–7.7)
Neutrophils Relative %: 95 %
Platelets: 233 10*3/uL (ref 150–400)
RBC: 4.36 MIL/uL (ref 3.87–5.11)
RDW: 15.4 % (ref 11.5–15.5)
WBC: 8.9 10*3/uL (ref 4.0–10.5)
nRBC: 0 % (ref 0.0–0.2)

## 2020-11-14 LAB — GLUCOSE, CAPILLARY: Glucose-Capillary: 131 mg/dL — ABNORMAL HIGH (ref 70–99)

## 2020-11-14 LAB — PHOSPHORUS: Phosphorus: 3.6 mg/dL (ref 2.5–4.6)

## 2020-11-14 LAB — MAGNESIUM: Magnesium: 1.9 mg/dL (ref 1.7–2.4)

## 2020-11-14 SURGERY — VIDEO BRONCHOSCOPY WITH ENDOBRONCHIAL NAVIGATION
Anesthesia: General

## 2020-11-14 MED ORDER — POTASSIUM CHLORIDE 10 MEQ/100ML IV SOLN
10.0000 meq | INTRAVENOUS | Status: AC
  Administered 2020-11-14 – 2020-11-15 (×5): 10 meq via INTRAVENOUS
  Filled 2020-11-14 (×6): qty 100

## 2020-11-14 MED ORDER — PROPOFOL 10 MG/ML IV BOLUS
INTRAVENOUS | Status: AC
  Filled 2020-11-14: qty 20

## 2020-11-14 MED ORDER — IPRATROPIUM-ALBUTEROL 0.5-2.5 (3) MG/3ML IN SOLN
RESPIRATORY_TRACT | Status: AC
  Filled 2020-11-14: qty 3

## 2020-11-14 MED ORDER — IPRATROPIUM-ALBUTEROL 0.5-2.5 (3) MG/3ML IN SOLN
3.0000 mL | RESPIRATORY_TRACT | Status: DC
  Administered 2020-11-14: 3 mL via RESPIRATORY_TRACT

## 2020-11-14 MED ORDER — FLUTICASONE FUROATE-VILANTEROL 200-25 MCG/INH IN AEPB
1.0000 | INHALATION_SPRAY | Freq: Every day | RESPIRATORY_TRACT | Status: DC
  Filled 2020-11-14: qty 28

## 2020-11-14 MED ORDER — LIDOCAINE HCL (PF) 2 % IJ SOLN
INTRAMUSCULAR | Status: AC
  Filled 2020-11-14: qty 10

## 2020-11-14 MED ORDER — CHLORHEXIDINE GLUCONATE 0.12 % MT SOLN
OROMUCOSAL | Status: AC
  Administered 2020-11-14: 15 mL via OROMUCOSAL
  Filled 2020-11-14: qty 15

## 2020-11-14 MED ORDER — FENTANYL CITRATE (PF) 100 MCG/2ML IJ SOLN
INTRAMUSCULAR | Status: AC
  Filled 2020-11-14: qty 2

## 2020-11-14 MED ORDER — LIDOCAINE HCL (PF) 2 % IJ SOLN
INTRAMUSCULAR | Status: AC
  Filled 2020-11-14: qty 5

## 2020-11-14 MED ORDER — ALBUTEROL SULFATE (2.5 MG/3ML) 0.083% IN NEBU: 2.5000 mg | INHALATION_SOLUTION | Freq: Once | RESPIRATORY_TRACT | Status: AC

## 2020-11-14 MED ORDER — LIDOCAINE HCL (CARDIAC) PF 100 MG/5ML IV SOSY
PREFILLED_SYRINGE | INTRAVENOUS | Status: DC | PRN
  Administered 2020-11-14: 100 mg via INTRAVENOUS

## 2020-11-14 MED ORDER — FENTANYL CITRATE (PF) 100 MCG/2ML IJ SOLN
50.0000 ug | Freq: Once | INTRAMUSCULAR | Status: AC
Start: 2020-11-14 — End: 2020-11-14
  Administered 2020-11-14: 50 ug via INTRAVENOUS

## 2020-11-14 MED ORDER — POLYETHYLENE GLYCOL 3350 17 G PO PACK: 17.0000 g | PACK | Freq: Every day | ORAL | Status: DC | PRN

## 2020-11-14 MED ORDER — EPHEDRINE 5 MG/ML INJ
INTRAVENOUS | Status: AC
  Filled 2020-11-14: qty 10

## 2020-11-14 MED ORDER — ONDANSETRON HCL 4 MG/2ML IJ SOLN
INTRAMUSCULAR | Status: AC
  Filled 2020-11-14: qty 2

## 2020-11-14 MED ORDER — ORAL CARE MOUTH RINSE: 15.0000 mL | Freq: Two times a day (BID) | OROMUCOSAL | Status: DC

## 2020-11-14 MED ORDER — MIDAZOLAM HCL 2 MG/2ML IJ SOLN
1.0000 mg | Freq: Once | INTRAMUSCULAR | Status: AC
  Administered 2020-11-14: 1 mg via INTRAVENOUS

## 2020-11-14 MED ORDER — EPHEDRINE SULFATE 50 MG/ML IJ SOLN
INTRAMUSCULAR | Status: DC | PRN
  Administered 2020-11-14: 5 mg via INTRAVENOUS
  Administered 2020-11-14: 10 mg via INTRAVENOUS
  Administered 2020-11-14: 5 mg via INTRAVENOUS

## 2020-11-14 MED ORDER — ALBUTEROL SULFATE (2.5 MG/3ML) 0.083% IN NEBU
INHALATION_SOLUTION | RESPIRATORY_TRACT | Status: AC
  Administered 2020-11-14: 2.5 mg via RESPIRATORY_TRACT
  Filled 2020-11-14: qty 3

## 2020-11-14 MED ORDER — MIDAZOLAM BOLUS VIA INFUSION: 1.0000 mg | Freq: Once | INTRAVENOUS | Status: DC

## 2020-11-14 MED ORDER — FENTANYL CITRATE (PF) 100 MCG/2ML IJ SOLN
50.0000 ug | Freq: Once | INTRAMUSCULAR | Status: AC
  Administered 2020-11-14: 50 ug via INTRAVENOUS

## 2020-11-14 MED ORDER — DOCUSATE SODIUM 100 MG PO CAPS: 100.0000 mg | ORAL_CAPSULE | Freq: Two times a day (BID) | ORAL | Status: DC | PRN

## 2020-11-14 MED ORDER — ROCURONIUM BROMIDE 100 MG/10ML IV SOLN
INTRAVENOUS | Status: DC | PRN
  Administered 2020-11-14: 50 mg via INTRAVENOUS
  Administered 2020-11-14 (×2): 10 mg via INTRAVENOUS

## 2020-11-14 MED ORDER — CHLORHEXIDINE GLUCONATE CLOTH 2 % EX PADS
6.0000 | MEDICATED_PAD | Freq: Every day | CUTANEOUS | Status: DC
  Administered 2020-11-14: 6 via TOPICAL

## 2020-11-14 MED ORDER — FENTANYL CITRATE (PF) 100 MCG/2ML IJ SOLN
25.0000 ug | INTRAMUSCULAR | Status: DC | PRN
  Administered 2020-11-14 – 2020-11-15 (×6): 25 ug via INTRAVENOUS
  Filled 2020-11-14 (×6): qty 2

## 2020-11-14 MED ORDER — ONDANSETRON HCL 4 MG/2ML IJ SOLN
INTRAMUSCULAR | Status: DC | PRN
  Administered 2020-11-14: 4 mg via INTRAVENOUS

## 2020-11-14 MED ORDER — PROPOFOL 10 MG/ML IV BOLUS
INTRAVENOUS | Status: DC | PRN
  Administered 2020-11-14: 120 mg via INTRAVENOUS
  Administered 2020-11-14: 20 mg via INTRAVENOUS

## 2020-11-14 MED ORDER — DEXAMETHASONE SODIUM PHOSPHATE 10 MG/ML IJ SOLN
INTRAMUSCULAR | Status: DC | PRN
  Administered 2020-11-14: 10 mg via INTRAVENOUS

## 2020-11-14 MED ORDER — FENTANYL CITRATE (PF) 100 MCG/2ML IJ SOLN
INTRAMUSCULAR | Status: DC | PRN
  Administered 2020-11-14 (×2): 50 ug via INTRAVENOUS

## 2020-11-14 MED ORDER — MIDAZOLAM HCL 2 MG/2ML IJ SOLN
INTRAMUSCULAR | Status: AC
  Administered 2020-11-14: 1 mg
  Filled 2020-11-14: qty 2

## 2020-11-14 MED ORDER — CHLORHEXIDINE GLUCONATE 0.12 % MT SOLN
15.0000 mL | Freq: Two times a day (BID) | OROMUCOSAL | Status: DC
  Administered 2020-11-14 – 2020-11-15 (×2): 15 mL via OROMUCOSAL
  Filled 2020-11-14 (×2): qty 15

## 2020-11-14 MED ORDER — DEXAMETHASONE SODIUM PHOSPHATE 10 MG/ML IJ SOLN
INTRAMUSCULAR | Status: AC
  Filled 2020-11-14: qty 1

## 2020-11-14 NOTE — Anesthesia Preprocedure Evaluation (Signed)
Anesthesia Evaluation  Patient identified by MRN, date of birth, ID band Patient awake  General Assessment Comment:Only had one  Episode of vomiting after one anesthetic  Reviewed: Allergy & Precautions, H&P , NPO status , Patient's Chart, lab work & pertinent test results, reviewed documented beta blocker date and time   History of Anesthesia Complications (+) PONV and history of anesthetic complications  Airway Mallampati: II  TM Distance: >3 FB Neck ROM: full    Dental no notable dental hx. (+) Teeth Intact, Dental Advisory Given   Pulmonary shortness of breath, sleep apnea and Continuous Positive Airway Pressure Ventilation , COPD,  COPD inhaler, former smoker,     + decreased breath sounds      Cardiovascular Exercise Tolerance: Good hypertension, (-) anginaNormal cardiovascular exam     Neuro/Psych  Headaches, negative psych ROS   GI/Hepatic Neg liver ROS, GERD  Controlled,  Endo/Other  negative endocrine ROS  Renal/GU negative Renal ROS  negative genitourinary   Musculoskeletal  (+) Arthritis ,   Abdominal   Peds  Hematology negative hematology ROS (+)   Anesthesia Other Findings Past Medical History: No date: Adrenal nodule (HCC) No date: Anemia No date: Anginal pain (HCC) No date: Arthritis No date: Cancer (HCC)     Comment:  cervical ca No date: Complication of anesthesia No date: COPD (chronic obstructive pulmonary disease) (HCC) No date: Depression No date: Dyspnea     Comment:  with exertion No date: GERD (gastroesophageal reflux disease) No date: Headache     Comment:  h/o migraines No date: Hyperlipidemia No date: Hypertension No date: PONV (postoperative nausea and vomiting)     Comment:  x1 and no problems since No date: Skin cancer No date: Sleep apnea     Comment:  CPAP   Reproductive/Obstetrics negative OB ROS                             Anesthesia  Physical  Anesthesia Plan  ASA: III  Anesthesia Plan: General   Post-op Pain Management:    Induction: Intravenous  PONV Risk Score and Plan: 4 or greater and Ondansetron and Dexamethasone  Airway Management Planned: Oral ETT  Additional Equipment:   Intra-op Plan:   Post-operative Plan: Extubation in OR  Informed Consent: I have reviewed the patients History and Physical, chart, labs and discussed the procedure including the risks, benefits and alternatives for the proposed anesthesia with the patient or authorized representative who has indicated his/her understanding and acceptance.     Dental Advisory Given  Plan Discussed with: CRNA  Anesthesia Plan Comments: (Patient consented for risks of anesthesia including but not limited to:  - adverse reactions to medications - damage to eyes, teeth, lips or other oral mucosa - nerve damage due to positioning  - sore throat or hoarseness - Damage to heart, brain, nerves, lungs, other parts of body or loss of life  Patient voiced understanding.)        Anesthesia Quick Evaluation

## 2020-11-14 NOTE — Op Note (Signed)
Robotic assisted bronchoscopy Cellvizio endomicroscopy  Indication: RIGHT lung nodules, multiple  Preoperative Diagnosis: Multiple lung nodules, right lung Post Procedure Diagnosis: Multiple lung nodules, right lung Consent: Verbal/Written  Benefits, limitations and potential complications of the procedure were discussed with the patient/family  including, but not limited to bleeding, hemoptysis, respiratory failure requiring intubation and/or prolongued mechanical ventilation, infection, pneumothorax (collapse of lung) requiring chest tube placement, stroke or even death.   Hand washing performed prior to starting the procedure.   Type of Anesthesia: General endotracheal anesthesia  Surgeon: Renold Don, MD Assistant/Scrub: Liborio Nixon, RRT/Larry Sloate, RRT Anesthesiologist/CRNA: Katy Fitch, MD/Catherine Beane, CRNA ROSE available?:  Yes, [facility]  Procedures Performed: 1) virtual Bronchoscopy with Multi-planar Image analysis, 3-D reconstruction of coronal, sagittal and multi-planar images for the purposes of planning real-time bronchoscopy using [facility] Robotic platform incorporating electromagnetic's, optical pattern recognition and robotic kinematic data.  Fluoroscopy as needed for sample acquisition. 2) Cellvizio endomicroscopy  Description of Procedure: The patient was taken to Procedure Room 2 (Bronchoscopy Suite) appropriate timeout was taken.  Patient was placed on the fluoroscopy table.  Patient was then inducted under general anesthesia by the anesthesia team.  She was intubated with an 8.5 ETT without difficulty.  A Portex adapter was placed on the ETT flange.  At this point the Olympus video bronchoscope was advanced through the Portex adapter and anatomic tour of the airway was performed.  Patient had copious secretions throughout that were suctioned and lavaged until clear.  The visible trachea was normal, carina was sharp, inspection of the right lung showed  friability of the mucosa but no other abnormalities or endobronchial lesions on the right upper lobe, right middle lobe and right lower lobe subsegments.  Inspection of the left lung showed friability of the mucosa but no other endobronchial lesions,on left upper lobe, lingula and lower lobe subsegments.  ET tube was placed approximately 4 cm above the carina.  Tube was secured there.  Having completed airway clearance and anatomic tour the [facility] Robotic platform airway introducer was placed over the ET tube after it was to the appropriate size.  The [facility] Robotic platform was then coupled to the airway introducer.  At this point registration was done according to [facility] protocol and once registration was completed, the [facility] scope was navigated to the right middle lobe to target 1, having acquired target, this was confirmed by fluoroscopy and by Cellvizio endomicroscopy.  The tissue visible appeared abnormal and the lesion appeared to have been acquired by the instrument.  Utilizing the [facility] imaging on fluoroscopy as guide brushings were performed x4.  Atypical cells were noted by ROSE.  In similar fashion transbronchial needle aspirates were performed x4.  ROSE revealed atypical cells, the remainder transbronchial needle aspirates were placed on CytoLyt.  In similar fashion transbronchial biopsies were performed with Monarch biopsy forceps.  A total of 10 biopsy specimens were obtained of the RIGHT middle lobe lesion, target 1.  Having completed this the scope was pulled back and re registration was performed.  The [facility] Robotic bronchoscope was then navigated to the RIGHT middle lobe aterior lesion (Target 2).  The lesion was sampled using transbronchial brushings after inspection with Cellvizio and the microscope.  Fluoroscopy was utilized during the sampling.  Sampling was performed x3 with the brush.  The description on ROSE was that the material appeared "milky".  At this point targeted  bronchoalveolar lavage was performed with 20 mL of saline instilled and approximately 7 mL obtained in return.  BAL was to be sent for cultures.  The procedure was at this point terminated.  The bronchoscope was retrieved and the patient was allowed to emerge from general anesthesia.  She was transferred to the PACU in satisfactory condition.  Specimans Obtained:  Transbronchial Fine Needle Aspirations 21G: X 4  Transbronchial Forceps Biopsy: X 12  Transbronchial Brush: X 4 each on positions  BAL: Aliquot 7 mL  Fluoroscopy:  Fluoroscopy was utilized during the course of this procedure to assure that biopsies were taken in a safe manner under fluoroscopic guidance with spot films required.     Complications: Postprocedure chest x-ray did  show 40% pneumothorax on right.  Estimated Blood Loss: minimal approx less than 2 mL    Assessment and Plan/Additional Comments: Multiple lung nodules right middle lobe Status post sampling with robotic assisted bronchoscopy Postprocedural pneumothorax on right  Plan will be to await pathology Chest tube placement on the right Patient will be admitted for observation  Patient's daughter was updated at length.  Renold Don, MD [facility]   *This note was dictated using voice recognition software/Dragon.  Despite best efforts to proofread, errors can occur which can change the meaning.  Any change was purely unintentional.

## 2020-11-14 NOTE — H&P (Signed)
NAME:  Lisa Cowan, MRN:  [id number], DOB:  07-13-1941, LOS: 0 ADMISSION DATE:  11/14/2020, CONSULTATION DATE:  11/14/20 REFERRING MD:  Patsey Berthold, CHIEF COMPLAINT:  PTX  History of Present Illness:  This is a 80 yo female with history of adrenal nodule, GERD, COPD, HTN, and HLD who presents for further workup and management of lung nodule on the right. Pateint was noted to have 3 enlarging nodules  Concerning for malignancy. Underwent navigational bronchoscopy in May of 2021 which was non-diagnositic. Cam in today for robotoc bronchsocopy. Patient developed pneumothorax after procedure and tus admitted to ICU for further monitoring.   Pertinent  Medical History  -GERD -COPD -HTN -OSA -HLD  Significant Hospital Events: Including procedures, antibiotic start and stop dates in addition to other pertinent events   . Patient admitted on 4/27 for PTX  Interim History / Subjective:  NA  Objective   Blood pressure 119/71, pulse 85, temperature (!) 96.9 F (36.1 C), resp. rate (!) 24, height 4\' 11"  (1.499 m), weight 81.6 kg, SpO2 99 %.        Intake/Output Summary (Last 24 hours) at 11/14/2020 1656 Last data filed at 11/14/2020 1522 Gross per 24 hour  Intake 600 ml  Output --  Net 600 ml   Filed Weights   11/14/20 1208  Weight: 81.6 kg    Examination: General: Patient pleasantly confuse HENT: Moist mucous membranes Lungs: Clear bilaterally Cardiovascular: RRR Abdomen: Soft non tender and non distended Extremities: no gross deformities Neuro: Following commands. No focal deficits appreciated GU: Deferred  Labs/imaging that I havepersonally reviewed  (right click and "Reselect all SmartList Selections" daily)  CXR with large right PTX improved after insertion of chest tube  Resolved Hospital Problem list   NA  Assessment & Plan:  This is a 80 year old female with history as noted above who presents for monitoring status post chest tube placement for  pneumothorax.  Pneumothorax-iatrogenic status post navigational bronchoscopy -Chest tube placed, on 20 of suction at this time -Continue on 20 of suction until a.m. -If no airleak continues can place to waterseal and potentially consider discharge with Heimlich valve  COPD on home Spiriva -We will start triple therapy while inpatient as patient had friable airways acute return inflammation per bronchoscopist  Hypertension- -we will hold home meds for now pending stability and length of stay can reinitiate home antihypertensives which include triamterene/hydrochlorothiazide  Chronic pain -Continue home gabapentin -Start fentanyl as needed for chest tube site pain     Best practice (right click and "Reselect all SmartList Selections" daily)  Diet:  Oral Pain/Anxiety/Delirium protocol (if indicated): No VAP protocol (if indicated): Not indicated DVT prophylaxis: Contraindicated GI prophylaxis: N/A Glucose control: NA Central venous access:  N/A Arterial line:  N/A Foley:  N/A Mobility:  bed rest  PT consulted: N/A Last date of multidisciplinary goals of care discussion Code Status:  full code Disposition: ICU for now  Labs   CBC: Recent Labs  Lab 11/12/20 0906  WBC 7.1  HGB 11.7*  HCT 36.0  MCV 81.6  PLT 833    Basic Metabolic Panel: Recent Labs  Lab 11/12/20 0906  NA 133*  K 3.3*  CL 92*  CO2 30  GLUCOSE 112*  BUN 10  CREATININE 0.73  CALCIUM 8.8*   GFR: Estimated Creatinine Clearance: 53.6 mL/min (by C-G formula based on SCr of 0.73 mg/dL). Recent Labs  Lab 11/12/20 0906  WBC 7.1    Liver Function Tests: No results for input(s): AST, ALT,  ALKPHOS, BILITOT, PROT, ALBUMIN in the last 168 hours. No results for input(s): LIPASE, AMYLASE in the last 168 hours. No results for input(s): AMMONIA in the last 168 hours.  ABG No results found for: PHART, PCO2ART, PO2ART, HCO3, TCO2, ACIDBASEDEF, O2SAT   Coagulation Profile: No results for input(s):  INR, PROTIME in the last 168 hours.  Cardiac Enzymes: No results for input(s): CKTOTAL, CKMB, CKMBINDEX, TROPONINI in the last 168 hours.  HbA1C: No results found for: HGBA1C  CBG: No results for input(s): GLUCAP in the last 168 hours.  Review of Systems:   Patient confused and difficult to attain. Pertinent positives and negatives per HPI  Past Medical History:  She,  has a past medical history of Adrenal nodule ([place]), Anemia, Anginal pain ([place]), Arthritis, Cancer ([place]), Complication of anesthesia, COPD (chronic obstructive pulmonary disease) ([place]), Depression, Dyspnea, GERD (gastroesophageal reflux disease), Headache, Hyperlipidemia, Hypertension, PONV (postoperative nausea and vomiting), Skin cancer, and Sleep apnea.   Surgical History:   Past Surgical History:  Procedure Laterality Date  . ABDOMINAL HYSTERECTOMY    . ADENOIDECTOMY    . BREAST BIOPSY Right 01/04/2016   stereo  . CARDIAC CATHETERIZATION Left 08/21/2016   Procedure: Left Heart Cath and Coronary Angiography;  Surgeon: Isaias Cowman, MD;  Location: [facility];  Service: Cardiovascular;  Laterality: Left;  . CATARACT EXTRACTION W/PHACO Right 05/18/2019   Procedure: CATARACT EXTRACTION PHACO AND INTRAOCULAR LENS PLACEMENT (IOC) Right  00:52.2  18.7%  9.59;  Surgeon: Leandrew Koyanagi, MD;  Location: [facility];  Service: Ophthalmology;  Laterality: Right;  sleep apnea  . CATARACT EXTRACTION W/PHACO Left 06/08/2019   Procedure: CATARACT EXTRACTION PHACO AND INTRAOCULAR LENS PLACEMENT (IOC) LEFT 9.60,    01:08.5,    14.0%;  Surgeon: Leandrew Koyanagi, MD;  Location: [facility];  Service: Ophthalmology;  Laterality: Left;  . COLONOSCOPY WITH PROPOFOL N/A 08/07/2017   Procedure: COLONOSCOPY WITH PROPOFOL;  Surgeon: Manya Silvas, MD;  Location[facility] ENDOSCOPY;  Service: Endoscopy;  Laterality: N/A;  . ESOPHAGOGASTRODUODENOSCOPY (EGD) WITH PROPOFOL N/A 08/07/2017   Procedure:  ESOPHAGOGASTRODUODENOSCOPY (EGD) WITH PROPOFOL;  Surgeon: Manya Silvas, MD;  Location[facility] ENDOSCOPY;  Service: Endoscopy;  Laterality: N/A;  . FOOT SURGERY     multiple  . FRACTURE SURGERY     great toe fx  . ROTATOR CUFF REPAIR Left   . TONSILLECTOMY    . VIDEO BRONCHOSCOPY WITH ENDOBRONCHIAL NAVIGATION N/A 12/05/2019   Procedure: VIDEO BRONCHOSCOPY WITH ENDOBRONCHIAL NAVIGATION;  Surgeon: Ottie Glazier, MD;  Location: [facility] ORS;  Service: Thoracic;  Laterality: N/A;  . VIDEO BRONCHOSCOPY WITH ENDOBRONCHIAL ULTRASOUND N/A 12/05/2019   Procedure: VIDEO BRONCHOSCOPY WITH ENDOBRONCHIAL ULTRASOUND;  Surgeon: Ottie Glazier, MD;  Location: [facility] ORS;  Service: Thoracic;  Laterality: N/A;     Social History:   reports that she quit smoking about 22 years ago. Her smoking use included cigarettes. She has a 30.00 pack-year smoking history. She has never used smokeless tobacco. She reports current alcohol use. She reports that she does not use drugs.   Family History:  Her family history includes Breast cancer (age of onset: 70) in her sister.   Allergies Allergies  Allergen Reactions  . Codeine Itching and Other (See Comments)    hallucinations  . Hydrocodone-Acetaminophen Itching and Other (See Comments)    hallucinations  . Nucynta [Tapentadol] Itching and Other (See Comments)    hallucinations  . Oxycodone-Acetaminophen Itching and Other (See Comments)    hallucinations  . Adhesive [Tape] Rash  .  Statins     Myalgia when taken daily     Home Medications  Prior to Admission medications   Medication Sig Start Date End Date Taking? Authorizing Provider  albuterol (VENTOLIN HFA) 108 (90 Base) MCG/ACT inhaler Inhale 1-2 puffs into the lungs every 6 (six) hours as needed for wheezing or shortness of breath.   Yes [provider]  atenolol (TENORMIN) 25 MG tablet Take 25 mg by mouth every morning.    Yes [provider]  Cyanocobalamin (VITAMIN B-12) 5000 MCG TBDP  Take 5,000 mcg by mouth daily.   Yes [provider]  FLUoxetine (PROZAC) 20 MG capsule Take 20 mg by mouth every morning.    Yes [provider]  gabapentin (NEURONTIN) 300 MG capsule Take 300 mg by mouth 3 (three) times daily as needed (back pain).   Yes [provider]  ipratropium (ATROVENT) 0.03 % nasal spray Place 2 sprays into both nostrils daily. 06/09/19  Yes [provider]  meclizine (ANTIVERT) 12.5 MG tablet Take 1 tablet (12.5 mg total) by mouth 2 (two) times daily as needed for dizziness. 10/29/19  Yes Duffy Bruce, MD  meloxicam (MOBIC) 7.5 MG tablet Take 7.5 mg by mouth daily.   Yes [provider]  methocarbamol (ROBAXIN) 500 MG tablet Take 500 mg by mouth 3 (three) times daily as needed for muscle spasms. 08/30/19  Yes [provider]  Multiple Vitamin (MULTIVITAMIN WITH MINERALS) TABS tablet Take 1 tablet by mouth daily.   Yes [provider]  Multiple Vitamins-Minerals (HAIR/SKIN/NAILS) TABS Take 1 tablet by mouth daily.   Yes [provider]  omeprazole (PRILOSEC) 20 MG capsule Take 20 mg by mouth 2 (two) times daily before a meal.   Yes [provider]  rosuvastatin (CRESTOR) 5 MG tablet Take 5 mg by mouth every Monday, Wednesday, and Friday.  03/17/18  Yes [provider]  SYSTANE ULTRA 0.4-0.3 % SOLN Place 1 drop into both eyes daily.   Yes [provider]  tiotropium (SPIRIVA) 18 MCG inhalation capsule Place 18 mcg into inhaler and inhale daily.   Yes [provider]  triamterene-hydrochlorothiazide (DYAZIDE) 37.5-25 MG capsule Take 1 capsule by mouth every morning.    Yes [provider]  COVID-19 mRNA vaccine, Pfizer, 30 MCG/0.3ML injection USE AS DIRECTED Patient not taking: No sig reported 06/19/20 06/19/21  Carlyle Basques, MD  Tiotropium Bromide-Olodaterol (STIOLTO RESPIMAT) 2.5-2.5 MCG/ACT AERS Inhale 2 puffs into the lungs daily. Patient not taking:  Reported on 10/29/2020 10/26/20   Tyler Pita, MD     Critical care time: 44 minutes

## 2020-11-14 NOTE — Anesthesia Procedure Notes (Signed)
Procedure Name: Intubation Performed by: Demetrius Charity, CRNA Pre-anesthesia Checklist: Patient identified, Patient being monitored, Timeout performed, Emergency Drugs available and Suction available Patient Re-evaluated:Patient Re-evaluated prior to induction Oxygen Delivery Method: Circle system utilized Preoxygenation: Pre-oxygenation with 100% oxygen Induction Type: IV induction Ventilation: Mask ventilation without difficulty Laryngoscope Size: 3 and McGraph Grade View: Grade II Tube type: Oral Tube size: 8.5 mm Number of attempts: 1 Airway Equipment and Method: Stylet and Video-laryngoscopy Placement Confirmation: ETT inserted through vocal cords under direct vision,  positive ETCO2 and breath sounds checked- equal and bilateral Secured at: 21 cm Tube secured with: Tape Dental Injury: Teeth and Oropharynx as per pre-operative assessment

## 2020-11-14 NOTE — Transfer of Care (Signed)
Immediate Anesthesia Transfer of Care Note  Patient: Lisa Cowan  Procedure(s) Performed: VIDEO BRONCHOSCOPY WITH ENDOBRONCHIAL NAVIGATION AND CELLVIZIO (N/A )  Patient Location: PACU  Anesthesia Type:General  Level of Consciousness: awake and alert   Airway & Oxygen Therapy: Patient Spontanous Breathing and Patient connected to face mask oxygen  Post-op Assessment: Report given to RN and Post -op Vital signs reviewed and stable  Post vital signs: Reviewed  Last Vitals:  Vitals Value Taken Time  BP    Temp    Pulse    Resp    SpO2      Last Pain:  Vitals:   11/14/20 1208  TempSrc: Oral  PainSc: 0-No pain         Complications: No complications documented.

## 2020-11-14 NOTE — Progress Notes (Signed)
Dr Patsey Berthold at bedside to place chest tube

## 2020-11-14 NOTE — Interval H&P Note (Signed)
History and Physical Interval Note:  11/14/2020 12:20 PM  Lisa Cowan  has presented today for surgery, with the diagnosis of LUNG NODULE.  The various methods of treatment have been discussed with the patient and family. After consideration of risks, benefits and other options for treatment, the patient has consented to  Procedure(s): [place] (N/A) as a surgical intervention.  The patient's history has been reviewed, patient examined, no change in status, stable for surgery.  I have reviewed the patient's chart and labs.  Questions were answered to the patient's satisfaction.     Vernard Gambles

## 2020-11-14 NOTE — Procedures (Addendum)
Chest Tube Insertion: Indication: Pneumothorax of right lung after biopsy  Consent: Verbal  Risks and benefits explained in detail including risk of infection, bleeding, respiratory failure and death.  Hand washing performed prior to starting the procedure.   Type of Anesthesia: 1 % Lidocaine.  Patient received 1 mg Versed, 100 mcg fentanyl for the procedure.  Procedure: An active timeout was performed and correct patient, name, & ID confirmed.  After explaining risks and benefits, patient positioned correctly for chest tube placement. Patient was prepped in a sterile fashion including chlorohexadine preps, sterile drape, sterile gown and sterile gloves. Introducer needle was placed and guide wire was inserted. Using Seldinger Technique, the introducer needle was removed and 1 dilator was used to create an opening for insertion of chest tube.  A [place] pneumothorax chest tube was then placed anteriorly on the right.  Respiratory variation was noted.  Chest tube sutured in place and proper dressing used.   Findings: Gush of air returned upon insetion of needle, chest tube placed bewteen 3-4 ibspace mi  Chest tube connected to: Heimlich valve initially then to Pleur-evac  Number of Attempts:2  Complications: Initial malposition of tube, replaced with new tube.  Estimated Blood Loss: Nil  Chest radiograph indicated and ordered.  After repositioning tube in good position, no pneumothorax left, good respiratory variation.  Grade 1/7 persistent leak.  Tube placed to -20 cm H2O suction.  Tolerated the procedure well.   Operator: Windy Canny. Derrill Kay, MD [facility]   *This note was dictated using voice recognition software/Dragon.  Despite best efforts to proofread, errors can occur which can change the meaning.  Any change was purely unintentional.

## 2020-11-14 NOTE — H&P (Signed)
Lisa Cowan is an 80 y.o. female.   Chief Complaint: Lung nodules  HPI:  This is a 80 year old female, remote former smoker (40-pack-year history), with  enlarging pulmonary nodules on the right lung who is kindly referred in consultation by Dr. Nolon Stalls.  The patient follows usually with Dr. Raul Del and Dr. Lanney Gins.  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  They are as detailed below.  She underwent bronchoscopy with ENB in May 2021.  This bronchoscopy was nondiagnostic.  The lesions are quite challenging in location and may be more amenable to diagnosis by robotic bronchoscopy.  The patient was presented at tumor board and the decision was to try to obtain more tissue.  The patient is however not an optimal surgical candidate.  She has however tolerated general anesthesia in the past for the ENB.  I discussed the case with Dr. Lanney Gins who agreed that robotic bronchoscopy may yield diagnosis.  This was discussed with the patient. Reports occasional cough only when she gets hot.  She also notices shortness of breath for which she has been on Spiriva.  She has not noted significant improvement on the Spiriva.  Not had any fevers, chills or sweats.  No weight loss or anorexia.  No orthopnea or paroxysmal nocturnal dyspnea.  Most recent pulmonary function testing was performed in April 2021 this showed an FEV1 of 1.52 L or 88% predicted, FVC 2.02 L or 92% predicted FEV1/FVC was 75% and FEF 25-75% was 80%.  Diffusion capacity was mildly to moderately reduced and flow volume loop was described as delayed.  This is consistent with mild obstruction.  She describes a "rash" on her abdomen for approximately 2 years.  Patient does not endorse any other symptoms or concerns.  She has had no occupational exposures.  She did reside in [place] previously.   Past Medical History:  Diagnosis Date  . Adrenal nodule ([place])    . Anemia   . Anginal pain ([place])   . Arthritis   . Cancer ([place])    cervical ca  . Complication of anesthesia   . COPD (chronic obstructive pulmonary disease) ([place])   . Depression   . Dyspnea    with exertion  . GERD (gastroesophageal reflux disease)   . Headache    h/o migraines  . Hyperlipidemia   . Hypertension   . PONV (postoperative nausea and vomiting)    x1 and no problems since  . Skin cancer   . Sleep apnea    CPAP    Past Surgical History:  Procedure Laterality Date  . ABDOMINAL HYSTERECTOMY    . ADENOIDECTOMY    . BREAST BIOPSY Right 01/04/2016   stereo  . CARDIAC CATHETERIZATION Left 08/21/2016   Procedure: Left Heart Cath and Coronary Angiography;  Surgeon: Isaias Cowman, MD;  Location: [facility];  Service: Cardiovascular;  Laterality: Left;  . CATARACT EXTRACTION W/PHACO Right 05/18/2019   Procedure: CATARACT EXTRACTION PHACO AND INTRAOCULAR LENS PLACEMENT (IOC) Right  00:52.2  18.7%  9.59;  Surgeon: Leandrew Koyanagi, MD;  Location: [facility];  Service: Ophthalmology;  Laterality: Right;  sleep apnea  . CATARACT EXTRACTION W/PHACO Left 06/08/2019   Procedure: CATARACT EXTRACTION PHACO AND INTRAOCULAR LENS PLACEMENT (IOC) LEFT 9.60,    01:08.5,    14.0%;  Surgeon: Leandrew Koyanagi, MD;  Location: [facility];  Service: Ophthalmology;  Laterality: Left;  . COLONOSCOPY WITH PROPOFOL N/A 08/07/2017  Procedure: COLONOSCOPY WITH PROPOFOL;  Surgeon: Manya Silvas, MD;  Location: [facility] ENDOSCOPY;  Service: Endoscopy;  Laterality: N/A;  . ESOPHAGOGASTRODUODENOSCOPY (EGD) WITH PROPOFOL N/A 08/07/2017   Procedure: ESOPHAGOGASTRODUODENOSCOPY (EGD) WITH PROPOFOL;  Surgeon: Manya Silvas, MD;  Location: [facility] ENDOSCOPY;  Service: Endoscopy;  Laterality: N/A;  . FOOT SURGERY     multiple  . FRACTURE SURGERY     great toe fx  . ROTATOR CUFF REPAIR Left   . TONSILLECTOMY    . VIDEO BRONCHOSCOPY WITH ENDOBRONCHIAL NAVIGATION N/A  12/05/2019   Procedure: VIDEO BRONCHOSCOPY WITH ENDOBRONCHIAL NAVIGATION;  Surgeon: Ottie Glazier, MD;  Location: [facility];  Service: Thoracic;  Laterality: N/A;  . VIDEO BRONCHOSCOPY WITH ENDOBRONCHIAL ULTRASOUND N/A 12/05/2019   Procedure: VIDEO BRONCHOSCOPY WITH ENDOBRONCHIAL ULTRASOUND;  Surgeon: Ottie Glazier, MD;  Location: [facility];  Service: Thoracic;  Laterality: N/A;    Family History  Problem Relation Age of Onset  . Breast cancer Sister 52   Social History:  Social History   Tobacco Use  . Smoking status: Former Smoker    Packs/day: 1.00    Years: 30.00    Pack years: 30.00    Types: Cigarettes    Quit date: 2000    Years since quitting: 22.3  . Smokeless tobacco: Never Used  Substance Use Topics  . Alcohol use: Yes    Comment: occassionally     Allergies:  Allergies  Allergen Reactions  . Codeine Itching and Other (See Comments)    hallucinations  . Hydrocodone-Acetaminophen Itching and Other (See Comments)    hallucinations  . Nucynta [Tapentadol] Itching and Other (See Comments)    hallucinations  . Oxycodone-Acetaminophen Itching and Other (See Comments)    hallucinations  . Statins     Myalgia when taken daily  . Adhesive [Tape] Rash    Medications Prior to Admission  Medication Sig Dispense Refill  . albuterol (VENTOLIN HFA) 108 (90 Base) MCG/ACT inhaler Inhale 1-2 puffs into the lungs every 6 (six) hours as needed for wheezing or shortness of breath.    Marland Kitchen atenolol (TENORMIN) 25 MG tablet Take 25 mg by mouth every morning.     . Cyanocobalamin (VITAMIN B-12) 5000 MCG TBDP Take 5,000 mcg by mouth daily.    Marland Kitchen FLUoxetine (PROZAC) 20 MG capsule Take 20 mg by mouth every morning.     . gabapentin (NEURONTIN) 300 MG capsule Take 300 mg by mouth 3 (three) times daily as needed (back pain).    Marland Kitchen ipratropium (ATROVENT) 0.03 % nasal spray Place 2 sprays into both nostrils daily.    . meclizine (ANTIVERT) 12.5 MG tablet Take 1 tablet (12.5 mg total) by  mouth 2 (two) times daily as needed for dizziness. 15 tablet 0  . meloxicam (MOBIC) 7.5 MG tablet Take 7.5 mg by mouth daily.    . methocarbamol (ROBAXIN) 500 MG tablet Take 500 mg by mouth 3 (three) times daily as needed for muscle spasms.    . Multiple Vitamin (MULTIVITAMIN WITH MINERALS) TABS tablet Take 1 tablet by mouth daily.    . Multiple Vitamins-Minerals (HAIR/SKIN/NAILS) TABS Take 1 tablet by mouth daily.    Marland Kitchen omeprazole (PRILOSEC) 20 MG capsule Take 20 mg by mouth 2 (two) times daily before a meal.    . rosuvastatin (CRESTOR) 5 MG tablet Take 5 mg by mouth every Monday, Wednesday, and Friday.   11  . SYSTANE ULTRA 0.4-0.3 % SOLN Place 1 drop into both eyes daily.    Marland Kitchen tiotropium (SPIRIVA) 18  MCG inhalation capsule Place 18 mcg into inhaler and inhale daily.    Marland Kitchen triamterene-hydrochlorothiazide (DYAZIDE) 37.5-25 MG capsule Take 1 capsule by mouth every morning.     Marland Kitchen COVID-19 mRNA vaccine, Pfizer, 30 MCG/0.3ML injection USE AS DIRECTED (Patient not taking: No sig reported) .3 mL 0  . Tiotropium Bromide-Olodaterol (STIOLTO RESPIMAT) 2.5-2.5 MCG/ACT AERS Inhale 2 puffs into the lungs daily. (Patient not taking: Reported on 10/29/2020) 12 g 2    Review of Systems A 10 point review of systems was performed and it is as noted above otherwise negative.  BP (!) 118/56   Pulse 70   Temp 98.5 F (36.9 C) (Oral)   Resp 18   Ht 4\' 11"  (1.499 m)   Wt 81.6 kg   SpO2 96%   BMI 36.36 kg/m   Physical Exam  GENERAL: Somewhat overweight woman, no acute distress.  Fully ambulatory.  No conversational dyspnea.   HEAD: Normocephalic, atraumatic.  EYES: Pupils equal, round, reactive to light.  No scleral icterus.  MOUTH: Nose/mouth/throat not examined due to masking requirements for COVID 19. NECK: Supple. No thyromegaly. Trachea midline. No JVD.  No adenopathy. PULMONARY: Symmetrical air entry.  Coarse breath sounds otherwise no adventitious sounds. CARDIOVASCULAR: S1 and S2. Regular rate and  rhythm.  Normal ABDOMEN: Benign. MUSCULOSKELETAL: No joint deformity, no clubbing, no edema.  NEUROLOGIC: No focal deficit, no gait disturbance, speech is fluent. SKIN: Intact,warm,dry. PSYCH: Mood and behavior normal    Assessment/Plan   ICD-10-CM   1. Pulmonary nodules  R91.8    Attempt at diagnosis with robotic bronchoscopy 27 April Patient not a candidate for surgical approach Concerning for  primary lung CA vs fungal infection   2. Chronic obstructive pulmonary disease, unspecified COPD type ([place])  J44.9    On Spiriva and PRN albuterol   3. OSA on CPAP  G47.33    Z99.89    This issue adds complexity to her management Has been well compensated in this regard  4. Gastroesophageal reflux disease, unspecified whether esophagitis present  K21.9    This issue adds complexity to her management   We will proceed with robotic assisted bronchoscopy addressing the right middle lobe pulm nodules.  Benefits, limitations and potential complications of the procedure were discussed with the patient/family  including, but not limited to bleeding, hemoptysis, respiratory failure requiring intubation and/or prolongued mechanical ventilation, infection, pneumothorax (collapse of lung) requiring chest tube placement, or even death.  Patient agrees to proceed.   Renold Don, MD [facility] 11/14/2020, 12:07 PM    *This note was dictated using voice recognition software/Dragon.  Despite best efforts to proofread, errors can occur which can change the meaning.  Any change was purely unintentional.

## 2020-11-14 NOTE — Progress Notes (Signed)
PHARMACY CONSULT NOTE - FOLLOW UP  Pharmacy Consult for Electrolyte Monitoring and Replacement   Recent Labs: Potassium (mmol/L)  Date Value  11/14/2020 3.0 (L)   Magnesium (mg/dL)  Date Value  11/14/2020 1.9   Calcium (mg/dL)  Date Value  11/14/2020 8.9   Albumin (g/dL)  Date Value  11/14/2020 3.8   Phosphorus (mg/dL)  Date Value  11/14/2020 3.6   Sodium (mmol/L)  Date Value  11/14/2020 131 (L)     Assessment: 80 yo female with history of adrenal nodule, GERD, COPD, HTN, and HLD who presents for further workup and management of lung nodule on the right. Pharmacy has been consulted for electrolyte management.   Goal of Therapy:  Electrolytes WNL  Plan:   K 3.0 - will order IV KCl 71mEq x5 runs  Na 131 - will hold off on replacement for now and continue to monitor; pt Na baseline looks to be low 130s  Re-check electrolytes with AM labs  Sherilyn Banker, PharmD Pharmacy Resident  11/14/2020 8:18 PM

## 2020-11-15 ENCOUNTER — Inpatient Hospital Stay: Payer: PPO

## 2020-11-15 ENCOUNTER — Encounter: Payer: Self-pay | Admitting: Pulmonary Disease

## 2020-11-15 LAB — BASIC METABOLIC PANEL
Anion gap: 10 (ref 5–15)
BUN: 10 mg/dL (ref 8–23)
CO2: 27 mmol/L (ref 22–32)
Calcium: 8.8 mg/dL — ABNORMAL LOW (ref 8.9–10.3)
Chloride: 95 mmol/L — ABNORMAL LOW (ref 98–111)
Creatinine, Ser: 0.6 mg/dL (ref 0.44–1.00)
GFR, Estimated: >60 mL/min (ref >60)
Glucose, Bld: 145 mg/dL — ABNORMAL HIGH (ref 70–99)
Potassium: 4 mmol/L (ref 3.5–5.1)
Sodium: 132 mmol/L — ABNORMAL LOW (ref 135–145)

## 2020-11-15 LAB — CYTOLOGY - NON PAP

## 2020-11-15 LAB — ACID FAST SMEAR (AFB, MYCOBACTERIA): Acid Fast Smear: NEGATIVE

## 2020-11-15 LAB — CBC
HCT: 34.9 % — ABNORMAL LOW (ref 36.0–46.0)
Hemoglobin: 11.1 g/dL — ABNORMAL LOW (ref 12.0–15.0)
MCH: 25.9 pg — ABNORMAL LOW (ref 26.0–34.0)
MCHC: 31.8 g/dL (ref 30.0–36.0)
MCV: 81.5 fL (ref 80.0–100.0)
Platelets: 271 10*3/uL (ref 150–400)
RBC: 4.28 MIL/uL (ref 3.87–5.11)
RDW: 15.7 % — ABNORMAL HIGH (ref 11.5–15.5)
WBC: 8.2 10*3/uL (ref 4.0–10.5)
nRBC: 0 % (ref 0.0–0.2)

## 2020-11-15 LAB — MAGNESIUM: Magnesium: 2 mg/dL (ref 1.7–2.4)

## 2020-11-15 LAB — PHOSPHORUS: Phosphorus: 2.7 mg/dL (ref 2.5–4.6)

## 2020-11-15 NOTE — Discharge Summary (Addendum)
Physician Discharge Summary  Patient ID: Lisa Cowan MRN: [id number] DOB/AGE: 79/07/1940 79 y.o.  Admit date: 11/14/2020 Discharge date: 11/15/2020    Discharge Diagnoses:  Pneumothorax (Iatrogenic) s/p Navigational Bronchoscopy COPD without Acute Exacerbation Hypertension                                                                     DISCHARGE PLAN BY DIAGNOSIS     Pneumothorax-iatrogenic status post navigational bronchoscopy -Chest tube removed -Follow up with Dr. Lanney Gins in office tomorrow 11/16/20, will perform repeat CXR at the office -DO NOT use CPAP overnight 11/15/20 until given clearance by Dr. Lanney Gins  COPD without Acute Exacerbation on home Spiriva -Supplemental Oxygen as needed for shortness of breath -As needed Bronchodilators (albuterol) for shortness of breath -Continue home Spiriva  Hypertension- -Resume home Antihypertensive meds                     DISCHARGE SUMMARY  This is a 80 yo female with history of adrenal nodule, GERD, COPD, HTN, and HLD who presents for further workup and management of lung nodule on the right. Pateint was noted to have 3 enlarging nodules  Concerning for malignancy. Underwent navigational bronchoscopy in May of 2021 which was non-diagnositic.   On 11/14/2020 underwent robotoc bronchsocopy. Patient developed pneumothorax after procedure requiring chest tube placement, and thus was admitted to ICU for further monitoring.   Chest X-ray on 11/15/20 showed resolution of Pneumothorax.  Chest tube was clamped and follow up CXR without pneumothorax.  Chest tube removed, will discharge home with Pulmonary follow up tomorrow.         SIGNIFICANT DIAGNOSTIC STUDIES 11/14/20: CXR>>There is a moderate right-sided pneumothorax without appreciable tension component. The nodular area seen on previous CT measuring 1.5 x 1.2 cm again noted. Lungs otherwise clear. Heart size and pulmonary vascularity normal. No adenopathy. No bone  lesions. There is aortic atherosclerosis 11/14/20: CXR>>There is now a chest tube on the right with the distal aspect of the chest tube lateral to the right pleural region. Pneumothorax is considerably smaller on the right, although residual lateral pneumothorax on the right noted. No tension component. Previously noted nodular opacity on the right not well seen. Ill-defined opacity right lower lobe potentially could represent hemorrhage given recent biopsy. Lungs elsewhere clear. Heart upper normal in size with pulmonary vascular normal. There is aortic atherosclerosis. There is arthropathy in the left shoulder. 11/15/20: CXR>>Right chest tube in stable position. No pneumothorax identified. Low lung volumes with mild bibasilar atelectasis. No pleural effusion. Heart size stable. Degenerative change thoracic spine  MICRO DATA  11/14/20: Acid Fast Culture>> 11/14/20: BAL culture>> 11/14/20: Fungus culture>>  ANTIBIOTICS N/A  CONSULTS PCCM  TUBES / LINES Right chest tube 4/27>>4/28  Discharge Exam: General: Awake and alert, chest tube in place, in NAD HENT: Moist mucous membranes Lungs: Clear bilaterally Cardiovascular: RRR Abdomen: Soft non tender and non distended Extremities: no gross deformities Neuro: Following commands. No focal deficits appreciated GU: Deferred  Vitals:   11/15/20 0600 11/15/20 0700 11/15/20 0800 11/15/20 0900  BP: 137/73 131/61 (!) 130/100 111/76  Pulse: 87 87 (!) 46 63  Resp: 19 (!) 28 (!) 24 20  Temp:    (!) 97.4 F (36.3 C)  TempSrc:  Axillary  SpO2: 98% 98% 97% 93%  Weight:      Height:         Discharge Labs  BMET Recent Labs  Lab 11/12/20 0906 11/14/20 1938 11/15/20 0403  NA 133* 131* 132*  K 3.3* 3.0* 4.0  CL 92* 91* 95*  CO2 _0 GLUCOSE 112* 171* 145*  BUN _1 CREATININE 0.73 0.69 0.60  CALCIUM 8.8* 8.9 8.8*  MG  --  1.9 2.0  PHOS  --  3.6 2.7    CBC Recent Labs  Lab 11/12/20 0906 11/14/20 1938  11/15/20 0403  HGB 11.7* 11.4* 11.1*  HCT 36.0 35.4* 34.9*  WBC 7.1 8.9 8.2  PLT 241 233 271    Anti-Coagulation No results for input(s): INR in the last 168 hours.  Discharge Instructions    Discharge patient   Complete by: As directed    Discharge disposition: 01-Home or Self Care   Discharge patient date: 11/14/2020          Allergies as of 11/15/2020      Reactions   Codeine Itching, Other (See Comments)   hallucinations   Hydrocodone-acetaminophen Itching, Other (See Comments)   hallucinations   Nucynta [tapentadol] Itching, Other (See Comments)   hallucinations   Oxycodone-acetaminophen Itching, Other (See Comments)   hallucinations   Adhesive [tape] Rash   Statins    Myalgia when taken daily      Medication List    STOP taking these medications   Pfizer-BioNTech COVID-19 Vacc 30 MCG/0.3ML injection Generic drug: COVID-19 mRNA vaccine Therapist, music)   Stiolto Respimat 2.5-2.5 MCG/ACT Aers Generic drug: Tiotropium Bromide-Olodaterol     TAKE these medications   albuterol 108 (90 Base) MCG/ACT inhaler Commonly known as: VENTOLIN HFA Inhale 1-2 puffs into the lungs every 6 (six) hours as needed for wheezing or shortness of breath.   atenolol 25 MG tablet Commonly known as: TENORMIN Take 25 mg by mouth every morning.   FLUoxetine 20 MG capsule Commonly known as: PROZAC Take 20 mg by mouth every morning.   gabapentin 300 MG capsule Commonly known as: NEURONTIN Take 300 mg by mouth. 2-3 times per day   Hair/Skin/Nails Tabs Take 1 tablet by mouth daily.   ipratropium 0.03 % nasal spray Commonly known as: ATROVENT Place 2 sprays into both nostrils daily as needed.   meclizine 12.5 MG tablet Commonly known as: ANTIVERT Take 1 tablet (12.5 mg total) by mouth 2 (two) times daily as needed for dizziness. What changed: when to take this   meloxicam 7.5 MG tablet Commonly known as: MOBIC Take 7.5 mg by mouth daily.   methocarbamol 500 MG  tablet Commonly known as: ROBAXIN Take 500 mg by mouth 3 (three) times daily as needed for muscle spasms.   multivitamin with minerals Tabs tablet Take 1 tablet by mouth every other day.   omeprazole 20 MG capsule Commonly known as: PRILOSEC Take 20 mg by mouth 2 (two) times daily before a meal.   rosuvastatin 5 MG tablet Commonly known as: CRESTOR Take 5 mg by mouth every Monday, Wednesday, and Friday.   Systane Ultra 0.4-0.3 % Soln Generic drug: Polyethyl Glycol-Propyl Glycol Place 2 drops into both eyes daily.   tiotropium 18 MCG inhalation capsule Commonly known as: SPIRIVA Place 18 mcg into inhaler and inhale daily.   triamterene-hydrochlorothiazide 37.5-25 MG capsule Commonly known as: DYAZIDE Take 1 capsule by mouth every morning.   Vitamin B-12 5000 MCG Tbdp Take 5,000 mcg by  mouth daily.         Disposition: Home  Discharged Condition: Mallory has met maximum benefit of inpatient care and is medically stable and cleared for discharge.  Patient is pending follow up as above.      Time spent on disposition:  50 Minutes.   Signed: Darel Hong, AGACNP-BC [facility] Prefer epic messenger for cross cover needs If after hours, please call E-link

## 2020-11-15 NOTE — Progress Notes (Signed)
PHARMACY CONSULT NOTE - FOLLOW UP  Pharmacy Consult for Electrolyte Monitoring and Replacement   Recent Labs: Potassium (mmol/L)  Date Value  11/15/2020 4.0   Magnesium (mg/dL)  Date Value  11/15/2020 2.0   Calcium (mg/dL)  Date Value  11/15/2020 8.8 (L)   Albumin (g/dL)  Date Value  11/14/2020 3.8   Phosphorus (mg/dL)  Date Value  11/15/2020 2.7   Sodium (mmol/L)  Date Value  11/15/2020 132 (L)    Assessment: 80 yo female with history of adrenal nodule, GERD, COPD, HTN, and HLD who presents for further workup and management of lung nodule on the right. Pharmacy has been consulted for electrolyte management.   Goal of Therapy:  Electrolytes WNL  Plan:   Na 132 - will hold off on replacement for now and continue to monitor; pt Na baseline looks to be low 130s  Re-check electrolytes with AM labs  Sherilyn Banker, PharmD Pharmacy Resident  11/15/2020 6:30 AM

## 2020-11-15 NOTE — Anesthesia Postprocedure Evaluation (Signed)
Anesthesia Post Note  Patient: Lisa Cowan  Procedure(s) Performed: VIDEO BRONCHOSCOPY WITH ENDOBRONCHIAL NAVIGATION AND CELLVIZIO (N/A )  Patient location during evaluation: ICU Anesthesia Type: General Level of consciousness: awake and alert and patient cooperative Pain management: pain level controlled Vital Signs Assessment: post-procedure vital signs reviewed and stable Respiratory status: spontaneous breathing Cardiovascular status: stable Postop Assessment: no apparent nausea or vomiting Anesthetic complications: no   No complications documented.   Last Vitals:  Vitals:   11/15/20 0500 11/15/20 0600  BP: 128/71 137/73  Pulse: 88 87  Resp: 18 19  Temp:    SpO2: 98% 98%    Last Pain:  Vitals:   11/15/20 0452  TempSrc:   PainSc: [place]

## 2020-11-15 NOTE — Progress Notes (Signed)
NAME:  Lisa Cowan, MRN:  [id number], DOB:  1940-09-10, LOS: 1 ADMISSION DATE:  11/14/2020, CONSULTATION DATE:  11/14/20 REFERRING MD:  Patsey Berthold, CHIEF COMPLAINT:  PTX  History of Present Illness:  This is a 80 yo female with history of adrenal nodule, GERD, COPD, HTN, and HLD who presents for further workup and management of lung nodule on the right. Pateint was noted to have 3 enlarging nodules  Concerning for malignancy. Underwent navigational bronchoscopy in May of 2021 which was non-diagnositic. Cam in today for robotoc bronchsocopy. Patient developed pneumothorax after procedure and tus admitted to ICU for further monitoring.    11/15/20- patient had chest tube removed after confirmation of resolved pneumothorax.  Will repeat CXR in tommorow.  Pertinent  Medical History  -GERD -COPD -HTN -OSA -HLD  Significant Hospital Events: Including procedures, antibiotic start and stop dates in addition to other pertinent events   . Patient admitted on 4/27 for PTX  Interim History / Subjective:  NA  Objective   Blood pressure 111/76, pulse 63, temperature (!) 97.4 F (36.3 C), temperature source Axillary, resp. rate 20, height 4\' 11"  (1.499 m), weight 81.6 kg, SpO2 93 %.        Intake/Output Summary (Last 24 hours) at 11/15/2020 1053 Last data filed at 11/15/2020 0700 Gross per 24 hour  Intake 2043.13 ml  Output 1325 ml  Net 718.13 ml   Filed Weights   11/14/20 1208  Weight: 81.6 kg    Examination: General: Patient pleasantly confuse HENT: Moist mucous membranes Lungs: Clear bilaterally Cardiovascular: RRR Abdomen: Soft non tender and non distended Extremities: no gross deformities Neuro: Following commands. No focal deficits appreciated GU: Deferred  Labs/imaging that I havepersonally reviewed  (right click and "Reselect all SmartList Selections" daily)  CXR with large right PTX improved after insertion of chest tube  Resolved Hospital Problem list    NA  Assessment & Plan:  This is a 80 year old female with history as noted above who presents for monitoring status post chest tube placement for pneumothorax. -resolved - 4/28 - will see tommorow in clinic  Pneumothorax-iatrogenic status post navigational bronchoscopy -Chest tube placed, on 20 of suction at this time -Continue on 20 of suction until a.m. -If no airleak continues can place to waterseal and potentially consider discharge with Heimlich valve  COPD on home Spiriva -We will start triple therapy while inpatient as patient had friable airways acute return inflammation per bronchoscopist  Hypertension- -we will hold home meds for now pending stability and length of stay can reinitiate home antihypertensives which include triamterene/hydrochlorothiazide  Chronic pain -Continue home gabapentin -Start fentanyl as needed for chest tube site pain     Best practice (right click and "Reselect all SmartList Selections" daily)  Diet:  Oral Pain/Anxiety/Delirium protocol (if indicated): No VAP protocol (if indicated): Not indicated DVT prophylaxis: Contraindicated GI prophylaxis: N/A Glucose control: NA Central venous access:  N/A Arterial line:  N/A Foley:  N/A Mobility:  bed rest  PT consulted: N/A Last date of multidisciplinary goals of care discussion Code Status:  full code Disposition: ICU for now  Labs   CBC: Recent Labs  Lab 11/12/20 0906 11/14/20 1938 11/15/20 0403  WBC 7.1 8.9 8.2  NEUTROABS  --  8.5*  --   HGB 11.7* 11.4* 11.1*  HCT 36.0 35.4* 34.9*  MCV 81.6 81.2 81.5  PLT 241 233 858    Basic Metabolic Panel: Recent Labs  Lab 11/12/20 0906 11/14/20 1938 11/15/20 0403  NA 133* 131*  132*  K 3.3* 3.0* 4.0  CL 92* 91* 95*  CO2 30 30 27   GLUCOSE 112* 171* 145*  BUN 10 10 10   CREATININE 0.73 0.69 0.60  CALCIUM 8.8* 8.9 8.8*  MG  --  1.9 2.0  PHOS  --  3.6 2.7   GFR: Estimated Creatinine Clearance: 53.6 mL/min (by C-G formula based on  SCr of 0.6 mg/dL). Recent Labs  Lab 11/12/20 0906 11/14/20 1938 11/15/20 0403  WBC 7.1 8.9 8.2    Liver Function Tests: Recent Labs  Lab 11/14/20 1938  AST 29  ALT 19  ALKPHOS 79  BILITOT 0.5  PROT 6.9  ALBUMIN 3.8   No results for input(s): LIPASE, AMYLASE in the last 168 hours. No results for input(s): AMMONIA in the last 168 hours.  ABG No results found for: PHART, PCO2ART, PO2ART, HCO3, TCO2, ACIDBASEDEF, O2SAT   Coagulation Profile: No results for input(s): INR, PROTIME in the last 168 hours.  Cardiac Enzymes: No results for input(s): CKTOTAL, CKMB, CKMBINDEX, TROPONINI in the last 168 hours.  HbA1C: No results found for: HGBA1C  CBG: Recent Labs  Lab 11/14/20 1802  GLUCAP 131*    Review of Systems:   Patient confused and difficult to attain. Pertinent positives and negatives per HPI  Past Medical History:  She,  has a past medical history of Adrenal nodule ([place]), Anemia, Anginal pain ([place]), Arthritis, Cancer ([place]), Complication of anesthesia, COPD (chronic obstructive pulmonary disease) ([place]), Depression, Dyspnea, GERD (gastroesophageal reflux disease), Headache, Hyperlipidemia, Hypertension, PONV (postoperative nausea and vomiting), Skin cancer, and Sleep apnea.   Surgical History:   Past Surgical History:  Procedure Laterality Date  . ABDOMINAL HYSTERECTOMY    . ADENOIDECTOMY    . BREAST BIOPSY Right 01/04/2016   stereo  . CARDIAC CATHETERIZATION Left 08/21/2016   Procedure: Left Heart Cath and Coronary Angiography;  Surgeon: Isaias Cowman, MD;  Location: [facility];  Service: Cardiovascular;  Laterality: Left;  . CATARACT EXTRACTION W/PHACO Right 05/18/2019   Procedure: CATARACT EXTRACTION PHACO AND INTRAOCULAR LENS PLACEMENT (IOC) Right  00:52.2  18.7%  9.59;  Surgeon: Leandrew Koyanagi, MD;  Location: [place];  Service: Ophthalmology;  Laterality: Right;  sleep apnea  . CATARACT EXTRACTION W/PHACO Left 06/08/2019    Procedure: CATARACT EXTRACTION PHACO AND INTRAOCULAR LENS PLACEMENT (IOC) LEFT 9.60,    01:08.5,    14.0%;  Surgeon: Leandrew Koyanagi, MD;  Location: [facility];  Service: Ophthalmology;  Laterality: Left;  . COLONOSCOPY WITH PROPOFOL N/A 08/07/2017   Procedure: COLONOSCOPY WITH PROPOFOL;  Surgeon: Manya Silvas, MD;  Location[facility] END[facility]COPY;  Service: Endoscopy;  Laterality: N/A;  . ESOPHAGOGASTRODUODENOSCOPY (EGD) WITH PROPOFOL N/A 08/07/2017   Procedure: ESOPHAGOGASTRODUODENOSCOPY (EGD) WITH PROPOFOL;  Surgeon: Manya Silvas, MD;  Location: [facility] END[facility]COPY;  Service: Endoscopy;  Laterality: N/A;  . FOOT SURGERY     multiple  . FRACTURE SURGERY     great toe fx  . ROTATOR CUFF REPAIR Left   . TONSILLECTOMY    . VIDEO BRONCHOSCOPY WITH ENDOBRONCHIAL NAVIGATION N/A 12/05/2019   Procedure: VIDEO BRONCHOSCOPY WITH ENDOBRONCHIAL NAVIGATION;  Surgeon: Ottie Glazier, MD;  Location: [facility];  Service: Thoracic;  Laterality: N/A;  . VIDEO BRONCHOSCOPY WITH ENDOBRONCHIAL NAVIGATION N/A 11/14/2020   Procedure: VIDEO BRONCHOSCOPY WITH ENDOBRONCHIAL NAVIGATION AND CELLVIZIO;  Surgeon: Tyler Pita, MD;  Location: [facility];  Service: Pulmonary;  Laterality: N/A;  . VIDEO BRONCHOSCOPY WITH ENDOBRONCHIAL ULTRASOUND N/A 12/05/2019   Procedure: VIDEO BRONCHOSCOPY WITH ENDOBRONCHIAL ULTRASOUND;  Surgeon: Ottie Glazier, MD;  Location: [facility];  Service: Thoracic;  Laterality: N/A;     Social History:   reports that she quit smoking about 22 years ago. Her smoking use included cigarettes. She has a 30.00 pack-year smoking history. She has never used smokeless tobacco. She reports current alcohol use. She reports that she does not use drugs.   Family History:  Her family history includes Breast cancer (age of onset: 61) in her sister.   Allergies Allergies  Allergen Reactions  . Codeine Itching and Other (See Comments)    hallucinations  . Hydrocodone-Acetaminophen Itching and  Other (See Comments)    hallucinations  . Nucynta [Tapentadol] Itching and Other (See Comments)    hallucinations  . Oxycodone-Acetaminophen Itching and Other (See Comments)    hallucinations  . Adhesive [Tape] Rash  . Statins     Myalgia when taken daily     Home Medications  Prior to Admission medications   Medication Sig Start Date End Date Taking? Authorizing Provider  albuterol (VENTOLIN HFA) 108 (90 Base) MCG/ACT inhaler Inhale 1-2 puffs into the lungs every 6 (six) hours as needed for wheezing or shortness of breath.   Yes [provider]  atenolol (TENORMIN) 25 MG tablet Take 25 mg by mouth every morning.    Yes [provider]  Cyanocobalamin (VITAMIN B-12) 5000 MCG TBDP Take 5,000 mcg by mouth daily.   Yes [provider]  FLUoxetine (PROZAC) 20 MG capsule Take 20 mg by mouth every morning.    Yes [provider]  gabapentin (NEURONTIN) 300 MG capsule Take 300 mg by mouth 3 (three) times daily as needed (back pain).   Yes [provider]  ipratropium (ATROVENT) 0.03 % nasal spray Place 2 sprays into both nostrils daily. 06/09/19  Yes [provider]  meclizine (ANTIVERT) 12.5 MG tablet Take 1 tablet (12.5 mg total) by mouth 2 (two) times daily as needed for dizziness. 10/29/19  Yes Duffy Bruce, MD  meloxicam (MOBIC) 7.5 MG tablet Take 7.5 mg by mouth daily.   Yes [provider]  methocarbamol (ROBAXIN) 500 MG tablet Take 500 mg by mouth 3 (three) times daily as needed for muscle spasms. 08/30/19  Yes [provider]  Multiple Vitamin (MULTIVITAMIN WITH MINERALS) TABS tablet Take 1 tablet by mouth daily.   Yes [provider]  Multiple Vitamins-Minerals (HAIR/SKIN/NAILS) TABS Take 1 tablet by mouth daily.   Yes [provider]  omeprazole (PRILOSEC) 20 MG capsule Take 20 mg by mouth 2 (two) times daily before a meal.   Yes [provider]  rosuvastatin (CRESTOR) 5 MG tablet Take 5  mg by mouth every Monday, Wednesday, and Friday.  03/17/18  Yes [provider]  SYSTANE ULTRA 0.4-0.3 % SOLN Place 1 drop into both eyes daily.   Yes [provider]  tiotropium (SPIRIVA) 18 MCG inhalation capsule Place 18 mcg into inhaler and inhale daily.   Yes [provider]  triamterene-hydrochlorothiazide (DYAZIDE) 37.5-25 MG capsule Take 1 capsule by mouth every morning.    Yes [provider]  COVID-19 mRNA vaccine, Pfizer, 30 MCG/0.3ML injection USE AS DIRECTED Patient not taking: No sig reported 06/19/20 06/19/21  Carlyle Basques, MD  Tiotropium Bromide-Olodaterol (STIOLTO RESPIMAT) 2.5-2.5 MCG/ACT AERS Inhale 2 puffs into the lungs daily. Patient not taking: Reported on 10/29/2020 10/26/20   Tyler Pita, MD    Critical care provider statement:    Critical care time (minutes):  33   Critical care  time was exclusive of:  Separately billable procedures and  treating other patients   Critical care was necessary to treat or prevent imminent or  life-threatening deterioration of the following conditions:  pneumothorax, multiple comorbid conditions.   Critical care was time spent personally by me on the following  activities:  Development of treatment plan with patient or surrogate,  discussions with consultants, evaluation of patient's response to  treatment, examination of patient, obtaining history from patient or  surrogate, ordering and performing treatments and interventions, ordering  and review of laboratory studies and re-evaluation of patient's condition   I assumed direction of critical care for this patient from another  provider in my specialty: no

## 2020-11-15 NOTE — Progress Notes (Signed)
Notified Marda Stalker, NP of pt complaining of potassium runs hurting her IV site. Verbal orders ok to slow rate to 50 mL/hr. Pt will receive 4 out of 5 bags total of potassium before AM labs. NP aware, will continue to monitor.

## 2020-11-16 DIAGNOSIS — J449 Chronic obstructive pulmonary disease, unspecified: Secondary | ICD-10-CM | POA: Diagnosis not present

## 2020-11-16 DIAGNOSIS — Z4682 Encounter for fitting and adjustment of non-vascular catheter: Secondary | ICD-10-CM | POA: Diagnosis not present

## 2020-11-16 DIAGNOSIS — M954 Acquired deformity of chest and rib: Secondary | ICD-10-CM | POA: Diagnosis not present

## 2020-11-16 LAB — SURGICAL PATHOLOGY

## 2020-11-17 LAB — CULTURE, BAL-QUANTITATIVE W GRAM STAIN
Culture: NO GROWTH
Special Requests: NORMAL

## 2020-11-19 LAB — BLASTOMYCES ANTIGEN: Blastomyces Antigen: NOT DETECTED ng/mL

## 2020-11-20 LAB — ALPHA-1 ANTITRYPSIN PHENOTYPE: A-1 Antitrypsin, Ser: 166 mg/dL (ref 101–187)

## 2020-11-30 DIAGNOSIS — R0600 Dyspnea, unspecified: Secondary | ICD-10-CM | POA: Diagnosis not present

## 2020-12-05 LAB — CULTURE, FUNGUS WITHOUT SMEAR: Special Requests: NORMAL

## 2020-12-13 DIAGNOSIS — R0602 Shortness of breath: Secondary | ICD-10-CM | POA: Diagnosis not present

## 2020-12-13 DIAGNOSIS — Z01818 Encounter for other preprocedural examination: Secondary | ICD-10-CM | POA: Diagnosis not present

## 2020-12-28 LAB — ACID FAST CULTURE WITH REFLEXED SENSITIVITIES (MYCOBACTERIA): Acid Fast Culture: NEGATIVE

## 2021-02-04 DIAGNOSIS — Z0000 Encounter for general adult medical examination without abnormal findings: Secondary | ICD-10-CM | POA: Diagnosis not present

## 2021-02-04 DIAGNOSIS — F3340 Major depressive disorder, recurrent, in remission, unspecified: Secondary | ICD-10-CM | POA: Diagnosis not present

## 2021-02-04 DIAGNOSIS — Z79899 Other long term (current) drug therapy: Secondary | ICD-10-CM | POA: Diagnosis not present

## 2021-02-04 DIAGNOSIS — R7309 Other abnormal glucose: Secondary | ICD-10-CM | POA: Diagnosis not present

## 2021-02-04 DIAGNOSIS — R911 Solitary pulmonary nodule: Secondary | ICD-10-CM | POA: Diagnosis not present

## 2021-02-04 DIAGNOSIS — G4733 Obstructive sleep apnea (adult) (pediatric): Secondary | ICD-10-CM | POA: Diagnosis not present

## 2021-02-04 DIAGNOSIS — I10 Essential (primary) hypertension: Secondary | ICD-10-CM | POA: Diagnosis not present

## 2021-02-04 DIAGNOSIS — C649 Malignant neoplasm of unspecified kidney, except renal pelvis: Secondary | ICD-10-CM | POA: Diagnosis not present

## 2021-02-04 DIAGNOSIS — J439 Emphysema, unspecified: Secondary | ICD-10-CM | POA: Diagnosis not present

## 2021-02-04 DIAGNOSIS — I700 Atherosclerosis of aorta: Secondary | ICD-10-CM | POA: Diagnosis not present

## 2021-02-04 DIAGNOSIS — D630 Anemia in neoplastic disease: Secondary | ICD-10-CM | POA: Diagnosis not present

## 2021-02-04 DIAGNOSIS — D649 Anemia, unspecified: Secondary | ICD-10-CM | POA: Diagnosis not present

## 2021-02-05 DIAGNOSIS — H04123 Dry eye syndrome of bilateral lacrimal glands: Secondary | ICD-10-CM | POA: Diagnosis not present

## 2021-02-11 DIAGNOSIS — R790 Abnormal level of blood mineral: Secondary | ICD-10-CM | POA: Diagnosis not present

## 2021-02-11 DIAGNOSIS — E278 Other specified disorders of adrenal gland: Secondary | ICD-10-CM | POA: Diagnosis not present

## 2021-02-11 DIAGNOSIS — F3340 Major depressive disorder, recurrent, in remission, unspecified: Secondary | ICD-10-CM | POA: Diagnosis not present

## 2021-02-11 DIAGNOSIS — I10 Essential (primary) hypertension: Secondary | ICD-10-CM | POA: Diagnosis not present

## 2021-02-11 DIAGNOSIS — G4733 Obstructive sleep apnea (adult) (pediatric): Secondary | ICD-10-CM | POA: Diagnosis not present

## 2021-02-11 DIAGNOSIS — D649 Anemia, unspecified: Secondary | ICD-10-CM | POA: Diagnosis not present

## 2021-02-11 DIAGNOSIS — Z0000 Encounter for general adult medical examination without abnormal findings: Secondary | ICD-10-CM | POA: Diagnosis not present

## 2021-02-11 DIAGNOSIS — J439 Emphysema, unspecified: Secondary | ICD-10-CM | POA: Diagnosis not present

## 2021-02-11 DIAGNOSIS — M5137 Other intervertebral disc degeneration, lumbosacral region: Secondary | ICD-10-CM | POA: Diagnosis not present

## 2021-02-11 DIAGNOSIS — R7309 Other abnormal glucose: Secondary | ICD-10-CM | POA: Diagnosis not present

## 2021-02-11 DIAGNOSIS — Z1231 Encounter for screening mammogram for malignant neoplasm of breast: Secondary | ICD-10-CM | POA: Diagnosis not present

## 2021-02-12 ENCOUNTER — Other Ambulatory Visit: Payer: Self-pay | Admitting: Internal Medicine

## 2021-02-12 DIAGNOSIS — Z1231 Encounter for screening mammogram for malignant neoplasm of breast: Secondary | ICD-10-CM

## 2021-02-14 DIAGNOSIS — G4733 Obstructive sleep apnea (adult) (pediatric): Secondary | ICD-10-CM | POA: Diagnosis not present

## 2021-02-22 DIAGNOSIS — D649 Anemia, unspecified: Secondary | ICD-10-CM | POA: Diagnosis not present

## 2021-02-22 DIAGNOSIS — R790 Abnormal level of blood mineral: Secondary | ICD-10-CM | POA: Diagnosis not present

## 2021-03-19 DIAGNOSIS — K625 Hemorrhage of anus and rectum: Secondary | ICD-10-CM | POA: Diagnosis not present

## 2021-03-19 DIAGNOSIS — Z86010 Personal history of colonic polyps: Secondary | ICD-10-CM | POA: Diagnosis not present

## 2021-03-19 DIAGNOSIS — K219 Gastro-esophageal reflux disease without esophagitis: Secondary | ICD-10-CM | POA: Diagnosis not present

## 2021-03-19 DIAGNOSIS — Z800 Family history of malignant neoplasm of digestive organs: Secondary | ICD-10-CM | POA: Diagnosis not present

## 2021-03-19 DIAGNOSIS — D500 Iron deficiency anemia secondary to blood loss (chronic): Secondary | ICD-10-CM | POA: Diagnosis not present

## 2021-03-19 DIAGNOSIS — G4733 Obstructive sleep apnea (adult) (pediatric): Secondary | ICD-10-CM | POA: Diagnosis not present

## 2021-05-02 ENCOUNTER — Encounter: Payer: Self-pay | Admitting: *Deleted

## 2021-05-03 ENCOUNTER — Encounter
Admission: RE | Disposition: A | Discharge status: Home / Self Care | Payer: Self-pay | Source: Ambulatory Visit | Attending: Gastroenterology

## 2021-05-03 ENCOUNTER — Ambulatory Visit
Admission: RE | Admit: 2021-05-03 | Discharge: 2021-05-03 | Disposition: A | Discharge status: Home / Self Care | Payer: PPO | Source: Ambulatory Visit | Attending: Gastroenterology | Admitting: Gastroenterology

## 2021-05-03 ENCOUNTER — Ambulatory Visit: Payer: PPO | Admitting: Anesthesiology

## 2021-05-03 ENCOUNTER — Encounter: Payer: Self-pay | Admitting: *Deleted

## 2021-05-03 DIAGNOSIS — Z91048 Other nonmedicinal substance allergy status: Secondary | ICD-10-CM | POA: Insufficient documentation

## 2021-05-03 DIAGNOSIS — Z888 Allergy status to other drugs, medicaments and biological substances status: Secondary | ICD-10-CM | POA: Insufficient documentation

## 2021-05-03 DIAGNOSIS — Z791 Long term (current) use of non-steroidal anti-inflammatories (NSAID): Secondary | ICD-10-CM | POA: Insufficient documentation

## 2021-05-03 DIAGNOSIS — K317 Polyp of stomach and duodenum: Secondary | ICD-10-CM | POA: Diagnosis not present

## 2021-05-03 DIAGNOSIS — Z800 Family history of malignant neoplasm of digestive organs: Secondary | ICD-10-CM | POA: Diagnosis not present

## 2021-05-03 DIAGNOSIS — Z6836 Body mass index (BMI) 36.0-36.9, adult: Secondary | ICD-10-CM | POA: Diagnosis not present

## 2021-05-03 DIAGNOSIS — K644 Residual hemorrhoidal skin tags: Secondary | ICD-10-CM | POA: Insufficient documentation

## 2021-05-03 DIAGNOSIS — Z885 Allergy status to narcotic agent status: Secondary | ICD-10-CM | POA: Insufficient documentation

## 2021-05-03 DIAGNOSIS — K648 Other hemorrhoids: Secondary | ICD-10-CM | POA: Diagnosis not present

## 2021-05-03 DIAGNOSIS — E669 Obesity, unspecified: Secondary | ICD-10-CM | POA: Insufficient documentation

## 2021-05-03 DIAGNOSIS — Z87891 Personal history of nicotine dependence: Secondary | ICD-10-CM | POA: Diagnosis not present

## 2021-05-03 DIAGNOSIS — K635 Polyp of colon: Secondary | ICD-10-CM | POA: Diagnosis not present

## 2021-05-03 DIAGNOSIS — K625 Hemorrhage of anus and rectum: Secondary | ICD-10-CM | POA: Diagnosis not present

## 2021-05-03 DIAGNOSIS — D509 Iron deficiency anemia, unspecified: Secondary | ICD-10-CM | POA: Diagnosis not present

## 2021-05-03 DIAGNOSIS — Z79899 Other long term (current) drug therapy: Secondary | ICD-10-CM | POA: Insufficient documentation

## 2021-05-03 DIAGNOSIS — K2950 Unspecified chronic gastritis without bleeding: Secondary | ICD-10-CM | POA: Diagnosis not present

## 2021-05-03 HISTORY — PX: ESOPHAGOGASTRODUODENOSCOPY (EGD) WITH PROPOFOL: SHX5813

## 2021-05-03 HISTORY — PX: COLONOSCOPY: SHX5424

## 2021-05-03 SURGERY — COLONOSCOPY
Anesthesia: General

## 2021-05-03 MED ORDER — PROPOFOL 500 MG/50ML IV EMUL
INTRAVENOUS | Status: DC | PRN
  Administered 2021-05-03: 125 ug/kg/min via INTRAVENOUS

## 2021-05-03 MED ORDER — EPHEDRINE SULFATE 50 MG/ML IJ SOLN
INTRAMUSCULAR | Status: DC | PRN
  Administered 2021-05-03 (×2): 5 mg via INTRAVENOUS

## 2021-05-03 MED ORDER — LIDOCAINE HCL (CARDIAC) PF 100 MG/5ML IV SOSY
PREFILLED_SYRINGE | INTRAVENOUS | Status: DC | PRN
  Administered 2021-05-03: 40 mg via INTRAVENOUS

## 2021-05-03 MED ORDER — ACETAMINOPHEN 160 MG/5ML PO SOLN
325.0000 mg | ORAL | Status: DC | PRN
  Filled 2021-05-03: qty 20.3

## 2021-05-03 MED ORDER — PROPOFOL 500 MG/50ML IV EMUL
INTRAVENOUS | Status: AC
  Filled 2021-05-03: qty 50

## 2021-05-03 MED ORDER — SODIUM CHLORIDE 0.9 % IV SOLN
INTRAVENOUS | Status: DC
  Administered 2021-05-03: 20 mL/h via INTRAVENOUS

## 2021-05-03 MED ORDER — PROPOFOL 10 MG/ML IV BOLUS: INTRAVENOUS | Status: DC | PRN

## 2021-05-03 MED ORDER — ACETAMINOPHEN 325 MG PO TABS: 650.0000 mg | ORAL_TABLET | Freq: Once | ORAL | Status: DC | PRN

## 2021-05-03 MED ORDER — PROPOFOL 10 MG/ML IV BOLUS
INTRAVENOUS | Status: AC
  Filled 2021-05-03: qty 20

## 2021-05-03 MED ORDER — PROPOFOL 10 MG/ML IV BOLUS
INTRAVENOUS | Status: DC | PRN
  Administered 2021-05-03: 40 mg via INTRAVENOUS
  Administered 2021-05-03: 50 mg via INTRAVENOUS

## 2021-05-03 MED ORDER — ONDANSETRON HCL 4 MG/2ML IJ SOLN: 4.0000 mg | Freq: Once | INTRAMUSCULAR | Status: DC | PRN

## 2021-05-03 NOTE — Op Note (Signed)
[facility] Gastroenterology Patient Name: Lisa Cowan Procedure Date: 05/03/2021 9:50 AM MRN: [id number] Account #: [id number] Date of Birth: June 09, 1941 Admit Type: Outpatient Age: 80 Room: [facility] ENDO ROOM 1 Gender: Female Note Status: Finalized Instrument Name: Upper Endoscope [id number] Procedure:             Upper GI endoscopy Indications:           Iron deficiency anemia Providers:             Annamaria Helling DO, DO Referring MD:          Tracie Harrier, MD (Referring MD) Medicines:             Monitored Anesthesia Care Complications:         No immediate complications. Estimated blood loss:                         Minimal. Procedure:             Pre-Anesthesia Assessment:                        - Prior to the procedure, a History and Physical was                         performed, and patient medications and allergies were                         reviewed. The patient is competent. The risks and                         benefits of the procedure and the sedation options and                         risks were discussed with the patient. All questions                         were answered and informed consent was obtained.                         Patient identification and proposed procedure were                         verified by the physician, the nurse, the anesthetist                         and the technician in the endoscopy suite. Mental                         Status Examination: alert and oriented. Airway                         Examination: normal oropharyngeal airway and neck                         mobility. Respiratory Examination: clear to                         auscultation. CV Examination: RRR, no murmurs, no S3  or S4. Prophylactic Antibiotics: The patient does not                         require prophylactic antibiotics. Prior                         Anticoagulants: The patient has taken no previous                          anticoagulant or antiplatelet agents. ASA Grade                         Assessment: III - A patient with severe systemic                         disease. After reviewing the risks and benefits, the                         patient was deemed in satisfactory condition to                         undergo the procedure. The anesthesia plan was to use                         monitored anesthesia care (MAC). Immediately prior to                         administration of medications, the patient was                         re-assessed for adequacy to receive sedatives. The                         heart rate, respiratory rate, oxygen saturations,                         blood pressure, adequacy of pulmonary ventilation, and                         response to care were monitored throughout the                         procedure. The physical status of the patient was                         re-assessed after the procedure.                        After obtaining informed consent, the endoscope was                         passed under direct vision. Throughout the procedure,                         the patient's blood pressure, pulse, and oxygen                         saturations were monitored continuously. The Endoscope  was introduced through the mouth, and advanced to the                         second part of duodenum. The upper GI endoscopy was                         accomplished without difficulty. The patient tolerated                         the procedure well. Findings:      The duodenal bulb, first portion of the duodenum and second portion of       the duodenum were normal. Biopsies for histology were taken with a cold       forceps for evaluation of celiac disease. Estimated blood loss was       minimal.      Multiple 1 to 2 mm sessile polyps with no bleeding and no stigmata of       recent bleeding were found on the greater curvature of the stomach.        Fundic gland appearing polyps; not biopsied. Estimated blood loss: none.      Normal mucosa was found in the entire examined stomach. Biopsies were       taken with a cold forceps for Helicobacter pylori testing. Estimated       blood loss was minimal.      The Z-line was regular and was found 35 cm from the incisors.      Esophagogastric landmarks were identified: the gastroesophageal junction       was found at 35 cm from the incisors.      Normal mucosa was found in the entire esophagus. Estimated blood loss:       none. Impression:            - Normal duodenal bulb, first portion of the duodenum                         and second portion of the duodenum. Biopsied.                        - Multiple gastric polyps.                        - Normal mucosa was found in the entire stomach.                         Biopsied.                        - Z-line regular, 35 cm from the incisors.                        - Esophagogastric landmarks identified.                        - Normal mucosa was found in the entire esophagus. Recommendation:        - Discharge patient to home.                        - Resume previous diet.                        -  Continue present medications.                        - Await pathology results.                        - Return to referring physician as previously                         scheduled. Procedure Code(s):     --- Professional ---                        [phone], Esophagogastroduodenoscopy, flexible,                         transoral; with biopsy, single or multiple Diagnosis Code(s):     --- Professional ---                        K31.7, Polyp of stomach and duodenum                        D50.9, Iron deficiency anemia, unspecified CPT copyright 2019 American Medical Association. All rights reserved. The codes documented in this report are preliminary and upon coder review may  be revised to meet current compliance requirements. Attending  Participation:      I personally performed the entire procedure. Volney American, DO Annamaria Helling DO, DO 05/03/2021 10:58:15 AM This report has been signed electronically. Number of Addenda: 0 Note Initiated On: 05/03/2021 9:50 AM Estimated Blood Loss:  Estimated blood loss was minimal.      [facility]

## 2021-05-03 NOTE — Interval H&P Note (Signed)
History and Physical Interval Note: Preprocedure H&P from 05/03/21  was reviewed and there was no interval change after seeing and examining the patient.  Written consent was obtained from the patient after discussion of risks, benefits, and alternatives. Patient has consented to proceed with Esophagogastroduodenoscopy and Colonoscopy with possible intervention   05/03/2021 10:03 AM  Lisa Cowan  has presented today for surgery, with the diagnosis of RECTAL BLEEDING IRON DEFICIENCY ANEMIA.  The various methods of treatment have been discussed with the patient and family. After consideration of risks, benefits and other options for treatment, the patient has consented to  Procedure(s): COLONOSCOPY (N/A) ESOPHAGOGASTRODUODENOSCOPY (EGD) WITH PROPOFOL (N/A) as a surgical intervention.  The patient's history has been reviewed, patient examined, no change in status, stable for surgery.  I have reviewed the patient's chart and labs.  Questions were answered to the patient's satisfaction.     Annamaria Helling

## 2021-05-03 NOTE — Transfer of Care (Signed)
Immediate Anesthesia Transfer of Care Note  Patient: Lisa Cowan  Procedure(s) Performed: COLONOSCOPY ESOPHAGOGASTRODUODENOSCOPY (EGD) WITH PROPOFOL  Patient Location: PACU  Anesthesia Type:General  Level of Consciousness: awake, alert  and oriented  Airway & Oxygen Therapy: Patient Spontanous Breathing  Post-op Assessment: Report given to RN and Post -op Vital signs reviewed and stable  Post vital signs: Reviewed and stable  Last Vitals:  Vitals Value Taken Time  BP 100/59 05/03/21 1052  Temp 35.8 C 05/03/21 1050  Pulse 72 05/03/21 1052  Resp 17 05/03/21 1052  SpO2 98 % 05/03/21 1052  Vitals shown include unvalidated device data.  Last Pain:  Vitals:   05/03/21 1050  TempSrc: Temporal  PainSc:          Complications: No notable events documented.

## 2021-05-03 NOTE — Anesthesia Preprocedure Evaluation (Signed)
Anesthesia Evaluation  Patient identified by MRN, date of birth, ID band Patient awake    Reviewed: Allergy & Precautions, NPO status , Patient's Chart, lab work & pertinent test results  History of Anesthesia Complications (+) PONV and history of anesthetic complications  Airway Mallampati: I   Neck ROM: Full    Dental  (+) Lower Dentures, Upper Dentures   Pulmonary sleep apnea and Continuous Positive Airway Pressure Ventilation , COPD, former smoker (quit 2000),    Pulmonary exam normal breath sounds clear to auscultation       Cardiovascular hypertension, Normal cardiovascular exam Rhythm:Regular Rate:Normal  ECG 11/12/20: SR with occ PVCs, LAFB, 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; no changes compared to prior   Neuro/Psych  Headaches,    GI/Hepatic GERD  ,  Endo/Other  Obesity   Renal/GU negative Renal ROS     Musculoskeletal  (+) Arthritis ,   Abdominal   Peds  Hematology  (+) Blood dyscrasia, anemia ,   Anesthesia Other Findings   Reproductive/Obstetrics Cervical cancer                             Anesthesia Physical Anesthesia Plan  ASA: 3  Anesthesia Plan: General   Post-op Pain Management:    Induction: Intravenous  PONV Risk Score and Plan: Propofol infusion, TIVA and Treatment may vary due to age or medical condition  Airway Management Planned: Natural Airway  Additional Equipment:   Intra-op Plan:   Post-operative Plan:   Informed Consent: I have reviewed the patients History and Physical, chart, labs and discussed the procedure including the risks, benefits and alternatives for the proposed anesthesia with the patient or authorized representative who has indicated his/her understanding and acceptance.       Plan Discussed with: CRNA  Anesthesia Plan Comments:         Anesthesia Quick Evaluation

## 2021-05-03 NOTE — Op Note (Signed)
[facility] Gastroenterology Patient Name: Lisa Cowan Procedure Date: 05/03/2021 9:50 AM MRN: [id number] Account #: [id number] Date of Birth: 12-May-1941 Admit Type: Outpatient Age: 80 Room: [facility] ENDO ROOM 1 Gender: Female Note Status: Finalized Instrument Name: Colonoscope [id number] Procedure:             Colonoscopy Indications:           Iron deficiency anemia Providers:             Annamaria Helling DO, DO Referring MD:          Tracie Harrier, MD (Referring MD) Medicines:             Monitored Anesthesia Care Complications:         No immediate complications. Estimated blood loss: None. Procedure:             Pre-Anesthesia Assessment:                        - Prior to the procedure, a History and Physical was                         performed, and patient medications and allergies were                         reviewed. The patient is competent. The risks and                         benefits of the procedure and the sedation options and                         risks were discussed with the patient. All questions                         were answered and informed consent was obtained.                         Patient identification and proposed procedure were                         verified by the physician, the nurse, the anesthetist                         and the technician in the endoscopy suite. Mental                         Status Examination: alert and oriented. Airway                         Examination: normal oropharyngeal airway and neck                         mobility. Respiratory Examination: clear to                         auscultation. CV Examination: RRR, no murmurs, no S3                         or S4. Prophylactic Antibiotics: The patient does not  require prophylactic antibiotics. Prior                         Anticoagulants: The patient has taken no previous                         anticoagulant or  antiplatelet agents. ASA Grade                         Assessment: III - A patient with severe systemic                         disease. After reviewing the risks and benefits, the                         patient was deemed in satisfactory condition to                         undergo the procedure. The anesthesia plan was to use                         monitored anesthesia care (MAC). Immediately prior to                         administration of medications, the patient was                         re-assessed for adequacy to receive sedatives. The                         heart rate, respiratory rate, oxygen saturations,                         blood pressure, adequacy of pulmonary ventilation, and                         response to care were monitored throughout the                         procedure. The physical status of the patient was                         re-assessed after the procedure.                        After obtaining informed consent, the colonoscope was                         passed under direct vision. Throughout the procedure,                         the patient's blood pressure, pulse, and oxygen                         saturations were monitored continuously. The                         Colonoscope was introduced through the anus and  advanced to the the terminal ileum, with                         identification of the appendiceal orifice and IC                         valve. The colonoscopy was performed without                         difficulty. The patient tolerated the procedure well.                         The quality of the bowel preparation was evaluated                         using the BBPS [facility] Bowel Preparation Scale) with                         scores of: Right Colon = 3, Transverse Colon = 3 and                         Left Colon = 3 (entire mucosa seen well with no                         residual staining, small fragments  of stool or opaque                         liquid). The total BBPS score equals 9. The terminal                         ileum, ileocecal valve, appendiceal orifice, and                         rectum were photographed. Findings:      Hemorrhoids were found on perianal exam.      The terminal ileum appeared normal.      A 20 to 25 mm polyp was found in the transverse colon. The polyp was       sessile ([place] 0-Is); appeared to have small erosions. Polypectomy was       not attempted due to polyp size (too large to be excised) and appeared       to cross fold with probing. Estimated blood loss: none. Area was       tattooed just distal (rectum side) to polyp with an injection of 3 mL of       [place] ink. Estimated blood loss: none.      The exam was otherwise without abnormality on direct and retroflexion       views. Impression:            - Hemorrhoids found on perianal exam.                        - The examined portion of the ileum was normal.                        - One 20 to 25 mm polyp in the transverse colon.  Resection not attempted.                        - The examination was otherwise normal on direct and                         retroflexion views.                        - No specimens collected. Recommendation:        - Discharge patient to home (ambulatory).                        - Resume previous diet.                        - Continue present medications.                        - Repeat colonoscopy for polyp resection. Will arrange                         for referral to advanced endoscopist.                        - Return to referring physician as previously                         scheduled.                        - Refer to Advanced Endoscopist at appointment to be                         scheduled. Procedure Code(s):     --- Professional ---                        [phone], Colonoscopy, flexible; diagnostic, including                          collection of specimen(s) by brushing or washing, when                         performed (separate procedure) Diagnosis Code(s):     --- Professional ---                        K64.9, Unspecified hemorrhoids                        K63.5, Polyp of colon                        D50.9, Iron deficiency anemia, unspecified CPT copyright 2019 American Medical Association. All rights reserved. The codes documented in this report are preliminary and upon coder review may  be revised to meet current compliance requirements. Attending Participation:      I personally performed the entire procedure. Volney American, DO Annamaria Helling DO, DO 05/03/2021 10:54:51 AM This report has been signed electronically. Number of Addenda: 0 Note Initiated On: 05/03/2021 9:50 AM Scope Withdrawal Time: 0 hours 9 minutes 40 seconds  Total Procedure Duration: 0 hours  23 minutes 35 seconds  Estimated Blood Loss:  Estimated blood loss: none.      [facility]

## 2021-05-03 NOTE — H&P (Signed)
[facility] Pre-Procedure H&P   Patient ID: Lisa Cowan is a 80 y.o. female.  Gastroenterology Provider: Annamaria Helling, DO  PCP: Tracie Harrier, MD  Date: 05/03/2021  HPI Ms. Lisa Cowan is a 80 y.o. female who presents today for Esophagogastroduodenoscopy and Colonoscopy for iron deficiency anemia and bright red blood per rectum.  Has noted BRBPR with wiping and in the commode. Has been off iron supplements since before her office visit. Still taking mobic, but also taking ppi. No abdominal pain or weight loss. No night sweats melena constipation or diarrhea.  Ferritin 9 at this time with Hgb of 11.7 MCV 81  Colonoscopy and EGD in 2019 - IH/EH noted, one TA, otherwise unremarkable. EGD was negative.  No other acute GI complaints.   Past Medical History:  Diagnosis Date   Adrenal nodule (HCC)    Anemia    Anginal pain (HCC)    Arthritis    Cancer (HCC)    cervical ca   Complication of anesthesia    COPD (chronic obstructive pulmonary disease) (HCC)    Depression    Dyspnea    with exertion   GERD (gastroesophageal reflux disease)    Headache    h/o migraines   Hyperlipidemia    Hypertension    PONV (postoperative nausea and vomiting)    x1 and no problems since   Skin cancer    Sleep apnea    CPAP    Past Surgical History:  Procedure Laterality Date   ABDOMINAL HYSTERECTOMY     ADENOIDECTOMY     BREAST BIOPSY Right 01/04/2016   stereo   CARDIAC CATHETERIZATION Left 08/21/2016   Procedure: Left Heart Cath and Coronary Angiography;  Surgeon: Isaias Cowman, MD;  Location: [place] [facility];  Service: Cardiovascular;  Laterality: Left;   CATARACT EXTRACTION W/PHACO Right 05/18/2019   Procedure: CATARACT EXTRACTION PHACO AND INTRAOCULAR LENS PLACEMENT (IOC) Right  00:52.2  18.7%  9.59;  Surgeon: Leandrew Koyanagi, MD;  Location: [place];  Service: Ophthalmology;  Laterality: Right;  sleep apnea   CATARACT  EXTRACTION W/PHACO Left 06/08/2019   Procedure: CATARACT EXTRACTION PHACO AND INTRAOCULAR LENS PLACEMENT (IOC) LEFT 9.60,    01:08.5,    14.0%;  Surgeon: Leandrew Koyanagi, MD;  Location: [place];  Service: Ophthalmology;  Laterality: Left;   COLONOSCOPY WITH PROPOFOL N/A 08/07/2017   Procedure: COLONOSCOPY WITH PROPOFOL;  Surgeon: Manya Silvas, MD;  Location: [facility] ENDOSCOPY;  Service: Endoscopy;  Laterality: N/A;   ESOPHAGOGASTRODUODENOSCOPY (EGD) WITH PROPOFOL N/A 08/07/2017   Procedure: ESOPHAGOGASTRODUODENOSCOPY (EGD) WITH PROPOFOL;  Surgeon: Manya Silvas, MD;  Location: [facility] ENDOSCOPY;  Service: Endoscopy;  Laterality: N/A;   EYE SURGERY     FOOT SURGERY     multiple   FRACTURE SURGERY     great toe fx   ROTATOR CUFF REPAIR Left    TONSILLECTOMY     VIDEO BRONCHOSCOPY WITH ENDOBRONCHIAL NAVIGATION N/A 12/05/2019   Procedure: VIDEO BRONCHOSCOPY WITH ENDOBRONCHIAL NAVIGATION;  Surgeon: Ottie Glazier, MD;  Location: [facility];  Service: Thoracic;  Laterality: N/A;   VIDEO BRONCHOSCOPY WITH ENDOBRONCHIAL NAVIGATION N/A 11/14/2020   Procedure: VIDEO BRONCHOSCOPY WITH ENDOBRONCHIAL NAVIGATION AND CELLVIZIO;  Surgeon: Tyler Pita, MD;  Location: [facility];  Service: Pulmonary;  Laterality: N/A;   VIDEO BRONCHOSCOPY WITH ENDOBRONCHIAL ULTRASOUND N/A 12/05/2019   Procedure: VIDEO BRONCHOSCOPY WITH ENDOBRONCHIAL ULTRASOUND;  Surgeon: Ottie Glazier, MD;  Location: [facility];  Service: Thoracic;  Laterality: N/A;    Family  History Cousin and sister with pancreatic cancer Brother and cousin with colon cancer No other h/o GI disease or malignancy  Review of Systems  Constitutional:  Negative for activity change, appetite change, fatigue, fever and unexpected weight change.  HENT:  Negative for trouble swallowing and voice change.   Respiratory:  Negative for shortness of breath and wheezing.   Cardiovascular:  Negative for chest pain and palpitations.   Gastrointestinal:  Positive for blood in stool (with wiping, resolved). Negative for abdominal distention, abdominal pain, anal bleeding, constipation, diarrhea, nausea, rectal pain and vomiting.  Musculoskeletal:  Negative for arthralgias and myalgias.  Skin:  Negative for color change and pallor.  Neurological:  Negative for dizziness, syncope and weakness.  Psychiatric/Behavioral:  Negative for confusion.   All other systems reviewed and are negative.   Medications No current facility-administered medications on file prior to encounter.   Current Outpatient Medications on File Prior to Encounter  Medication Sig Dispense Refill   albuterol (VENTOLIN HFA) 108 (90 Base) MCG/ACT inhaler Inhale 1-2 puffs into the lungs every 6 (six) hours as needed for wheezing or shortness of breath.     atenolol (TENORMIN) 25 MG tablet Take 25 mg by mouth every morning.      Cyanocobalamin (VITAMIN B-12) 5000 MCG TBDP Take 5,000 mcg by mouth daily.     FLUoxetine (PROZAC) 20 MG capsule Take 20 mg by mouth every morning.      gabapentin (NEURONTIN) 300 MG capsule Take 300 mg by mouth. 2-3 times per day     ipratropium (ATROVENT) 0.03 % nasal spray Place 2 sprays into both nostrils daily as needed.     meclizine (ANTIVERT) 12.5 MG tablet Take 1 tablet (12.5 mg total) by mouth 2 (two) times daily as needed for dizziness. (Patient taking differently: Take 12.5 mg by mouth daily as needed for dizziness.) 15 tablet 0   meloxicam (MOBIC) 7.5 MG tablet Take 7.5 mg by mouth daily.     methocarbamol (ROBAXIN) 500 MG tablet Take 500 mg by mouth 3 (three) times daily as needed for muscle spasms.     Multiple Vitamin (MULTIVITAMIN WITH MINERALS) TABS tablet Take 1 tablet by mouth every other day.     Multiple Vitamins-Minerals (HAIR/SKIN/NAILS) TABS Take 1 tablet by mouth daily.     omeprazole (PRILOSEC) 20 MG capsule Take 20 mg by mouth 2 (two) times daily before a meal.     rosuvastatin (CRESTOR) 5 MG tablet Take 5 mg  by mouth every Monday, Wednesday, and Friday.   11   SYSTANE ULTRA 0.4-0.3 % SOLN Place 2 drops into both eyes daily.     tiotropium (SPIRIVA) 18 MCG inhalation capsule Place 18 mcg into inhaler and inhale daily.     triamterene-hydrochlorothiazide (DYAZIDE) 37.5-25 MG capsule Take 1 capsule by mouth every morning.       Pertinent medications related to GI and procedure were reviewed by me with the patient prior to the procedure   Current Facility-Administered Medications:    0.9 %  sodium chloride infusion, , Intravenous, Continuous, Annamaria Helling, DO, Last Rate: 20 mL/hr at 05/03/21 0900, 20 mL/hr at 05/03/21 0900  sodium chloride 20 mL/hr (05/03/21 0900)       Allergies  Allergen Reactions   Codeine Itching and Other (See Comments)    hallucinations   Hydrocodone-Acetaminophen Itching and Other (See Comments)    hallucinations   Nucynta [Tapentadol] Itching and Other (See Comments)    hallucinations   Oxycodone-Acetaminophen Itching and Other (See  Comments)    hallucinations   Adhesive [Tape] Rash   Statins     Myalgia when taken daily   Allergies were reviewed by me prior to the procedure  Objective    Vitals:   05/03/21 0849  BP: (!) 142/90  Pulse: 85  Resp: 20  Temp: (!) 96.2 F (35.7 C)  TempSrc: Temporal  SpO2: 97%  Weight: 81.6 kg  Height: 4\' 11"  (1.499 m)     Physical Exam Vitals reviewed.  Constitutional:      General: She is not in acute distress.    Appearance: Normal appearance. She is obese. She is not ill-appearing, toxic-appearing or diaphoretic.  HENT:     Head: Normocephalic and atraumatic.     Nose: Nose normal.     Mouth/Throat:     Mouth: Mucous membranes are moist.     Pharynx: Oropharynx is clear.  Eyes:     General: No scleral icterus.    Extraocular Movements: Extraocular movements intact.  Cardiovascular:     Rate and Rhythm: Normal rate and regular rhythm.     Heart sounds: Normal heart sounds. No murmur heard.   No  friction rub. No gallop.  Pulmonary:     Effort: Pulmonary effort is normal. No respiratory distress.     Breath sounds: Normal breath sounds. No wheezing, rhonchi or rales.  Abdominal:     General: Abdomen is flat. Bowel sounds are normal. There is no distension.     Palpations: Abdomen is soft.     Tenderness: There is no abdominal tenderness. There is no guarding or rebound.  Musculoskeletal:     Cervical back: Neck supple.     Right lower leg: No edema.     Left lower leg: No edema.  Skin:    General: Skin is warm and dry.     Coloration: Skin is not jaundiced or pale.  Neurological:     General: No focal deficit present.     Mental Status: She is alert and oriented to person, place, and time. Mental status is at baseline.  Psychiatric:        Mood and Affect: Mood normal.        Behavior: Behavior normal.        Thought Content: Thought content normal.        Judgment: Judgment normal.     Assessment:  Ms. LYNDE LUDWIG is a 80 y.o. female  who presents today for Esophagogastroduodenoscopy and Colonoscopy for iron deficiency anemia and bright red blood per rectum.  Plan:  Esophagogastroduodenoscopy and Colonoscopy with possible intervention today  Esophagogastroduodenoscopy and colonoscopy with possible biopsy, control of bleeding, polypectomy, and interventions as necessary has been discussed with the patient/patient representative. Informed consent was obtained from the patient/patient representative after explaining the indication, nature, and risks of the procedure including but not limited to death, bleeding, perforation, missed neoplasm/lesions, cardiorespiratory compromise, and reaction to medications. Opportunity for questions was given and appropriate answers were provided. Patient/patient representative has verbalized understanding is amenable to undergoing the procedure.   Annamaria Helling, DO  [facility] Gastroenterology  Portions of the record may have  been created with voice recognition software. Occasional wrong-word or 'sound-a-like' substitutions may have occurred due to the inherent limitations of voice recognition software.  Read the chart carefully and recognize, using context, where substitutions may have occurred.

## 2021-05-03 NOTE — Anesthesia Postprocedure Evaluation (Signed)
Anesthesia Post Note  Patient: Alletta L Moyano  Procedure(s) Performed: COLONOSCOPY ESOPHAGOGASTRODUODENOSCOPY (EGD) WITH PROPOFOL  Patient location during evaluation: PACU Anesthesia Type: General Level of consciousness: awake and alert, oriented and patient cooperative Pain management: pain level controlled Vital Signs Assessment: post-procedure vital signs reviewed and stable Respiratory status: spontaneous breathing, nonlabored ventilation and respiratory function stable Cardiovascular status: blood pressure returned to baseline and stable Postop Assessment: adequate PO intake Anesthetic complications: no   No notable events documented.   Last Vitals:  Vitals:   05/03/21 1050 05/03/21 1100  BP: (!) 100/59 (!) 103/53  Pulse: 74 69  Resp: 15 (!) 21  Temp: (!) 35.8 C   SpO2: 97% 97%    Last Pain:  Vitals:   05/03/21 1100  TempSrc:   PainSc: 0-No pain                 Darrin Nipper

## 2021-05-06 ENCOUNTER — Encounter: Payer: Self-pay | Admitting: Gastroenterology

## 2021-05-06 LAB — SURGICAL PATHOLOGY

## 2021-05-20 DIAGNOSIS — G4733 Obstructive sleep apnea (adult) (pediatric): Secondary | ICD-10-CM | POA: Diagnosis not present

## 2021-05-27 DIAGNOSIS — D123 Benign neoplasm of transverse colon: Secondary | ICD-10-CM | POA: Diagnosis not present

## 2021-06-20 DIAGNOSIS — J449 Chronic obstructive pulmonary disease, unspecified: Secondary | ICD-10-CM | POA: Diagnosis not present

## 2021-06-26 DIAGNOSIS — J449 Chronic obstructive pulmonary disease, unspecified: Secondary | ICD-10-CM | POA: Diagnosis not present

## 2021-07-04 DIAGNOSIS — J449 Chronic obstructive pulmonary disease, unspecified: Secondary | ICD-10-CM | POA: Diagnosis not present

## 2021-07-30 DIAGNOSIS — J449 Chronic obstructive pulmonary disease, unspecified: Secondary | ICD-10-CM | POA: Diagnosis not present

## 2021-07-30 DIAGNOSIS — Z7951 Long term (current) use of inhaled steroids: Secondary | ICD-10-CM | POA: Diagnosis not present

## 2021-07-30 DIAGNOSIS — Z87891 Personal history of nicotine dependence: Secondary | ICD-10-CM | POA: Diagnosis not present

## 2021-07-30 DIAGNOSIS — I10 Essential (primary) hypertension: Secondary | ICD-10-CM | POA: Diagnosis not present

## 2021-07-30 DIAGNOSIS — K648 Other hemorrhoids: Secondary | ICD-10-CM | POA: Diagnosis not present

## 2021-07-30 DIAGNOSIS — E785 Hyperlipidemia, unspecified: Secondary | ICD-10-CM | POA: Diagnosis not present

## 2021-07-30 DIAGNOSIS — K219 Gastro-esophageal reflux disease without esophagitis: Secondary | ICD-10-CM | POA: Diagnosis not present

## 2021-07-30 DIAGNOSIS — E119 Type 2 diabetes mellitus without complications: Secondary | ICD-10-CM | POA: Diagnosis not present

## 2021-07-30 DIAGNOSIS — C183 Malignant neoplasm of hepatic flexure: Secondary | ICD-10-CM | POA: Diagnosis not present

## 2021-07-30 DIAGNOSIS — K644 Residual hemorrhoidal skin tags: Secondary | ICD-10-CM | POA: Diagnosis not present

## 2021-07-30 DIAGNOSIS — M1990 Unspecified osteoarthritis, unspecified site: Secondary | ICD-10-CM | POA: Diagnosis not present

## 2021-07-30 DIAGNOSIS — K635 Polyp of colon: Secondary | ICD-10-CM | POA: Diagnosis not present

## 2021-07-30 DIAGNOSIS — Z86010 Personal history of colonic polyps: Secondary | ICD-10-CM | POA: Diagnosis not present

## 2021-07-30 DIAGNOSIS — Z8541 Personal history of malignant neoplasm of cervix uteri: Secondary | ICD-10-CM | POA: Diagnosis not present

## 2021-07-30 DIAGNOSIS — Z79899 Other long term (current) drug therapy: Secondary | ICD-10-CM | POA: Diagnosis not present

## 2021-07-30 DIAGNOSIS — Z9889 Other specified postprocedural states: Secondary | ICD-10-CM | POA: Diagnosis not present

## 2021-07-30 DIAGNOSIS — G4733 Obstructive sleep apnea (adult) (pediatric): Secondary | ICD-10-CM | POA: Diagnosis not present

## 2021-07-30 DIAGNOSIS — E1140 Type 2 diabetes mellitus with diabetic neuropathy, unspecified: Secondary | ICD-10-CM | POA: Diagnosis not present

## 2021-08-04 DIAGNOSIS — J449 Chronic obstructive pulmonary disease, unspecified: Secondary | ICD-10-CM | POA: Diagnosis not present

## 2021-08-12 DIAGNOSIS — C183 Malignant neoplasm of hepatic flexure: Secondary | ICD-10-CM | POA: Diagnosis not present

## 2021-08-12 DIAGNOSIS — Z79899 Other long term (current) drug therapy: Secondary | ICD-10-CM | POA: Diagnosis not present

## 2021-08-12 DIAGNOSIS — J439 Emphysema, unspecified: Secondary | ICD-10-CM | POA: Diagnosis not present

## 2021-08-12 DIAGNOSIS — Z87891 Personal history of nicotine dependence: Secondary | ICD-10-CM | POA: Diagnosis not present

## 2021-08-12 DIAGNOSIS — C184 Malignant neoplasm of transverse colon: Secondary | ICD-10-CM | POA: Diagnosis not present

## 2021-08-12 DIAGNOSIS — R918 Other nonspecific abnormal finding of lung field: Secondary | ICD-10-CM | POA: Diagnosis not present

## 2021-08-12 DIAGNOSIS — E279 Disorder of adrenal gland, unspecified: Secondary | ICD-10-CM | POA: Diagnosis not present

## 2021-09-04 DIAGNOSIS — R7309 Other abnormal glucose: Secondary | ICD-10-CM | POA: Diagnosis not present

## 2021-09-04 DIAGNOSIS — F3340 Major depressive disorder, recurrent, in remission, unspecified: Secondary | ICD-10-CM | POA: Diagnosis not present

## 2021-09-04 DIAGNOSIS — M5137 Other intervertebral disc degeneration, lumbosacral region: Secondary | ICD-10-CM | POA: Diagnosis not present

## 2021-09-04 DIAGNOSIS — J439 Emphysema, unspecified: Secondary | ICD-10-CM | POA: Diagnosis not present

## 2021-09-04 DIAGNOSIS — J449 Chronic obstructive pulmonary disease, unspecified: Secondary | ICD-10-CM | POA: Diagnosis not present

## 2021-09-04 DIAGNOSIS — Z1231 Encounter for screening mammogram for malignant neoplasm of breast: Secondary | ICD-10-CM | POA: Diagnosis not present

## 2021-09-04 DIAGNOSIS — G4733 Obstructive sleep apnea (adult) (pediatric): Secondary | ICD-10-CM | POA: Diagnosis not present

## 2021-09-04 DIAGNOSIS — Z79899 Other long term (current) drug therapy: Secondary | ICD-10-CM | POA: Diagnosis not present

## 2021-09-04 DIAGNOSIS — D649 Anemia, unspecified: Secondary | ICD-10-CM | POA: Diagnosis not present

## 2021-09-04 DIAGNOSIS — I10 Essential (primary) hypertension: Secondary | ICD-10-CM | POA: Diagnosis not present

## 2021-09-04 DIAGNOSIS — R790 Abnormal level of blood mineral: Secondary | ICD-10-CM | POA: Diagnosis not present

## 2021-09-05 DIAGNOSIS — K219 Gastro-esophageal reflux disease without esophagitis: Secondary | ICD-10-CM | POA: Diagnosis not present

## 2021-09-05 DIAGNOSIS — I2510 Atherosclerotic heart disease of native coronary artery without angina pectoris: Secondary | ICD-10-CM | POA: Diagnosis not present

## 2021-09-05 DIAGNOSIS — G5793 Unspecified mononeuropathy of bilateral lower limbs: Secondary | ICD-10-CM | POA: Diagnosis not present

## 2021-09-05 DIAGNOSIS — E119 Type 2 diabetes mellitus without complications: Secondary | ICD-10-CM | POA: Diagnosis not present

## 2021-09-05 DIAGNOSIS — G4733 Obstructive sleep apnea (adult) (pediatric): Secondary | ICD-10-CM | POA: Diagnosis not present

## 2021-09-05 DIAGNOSIS — I10 Essential (primary) hypertension: Secondary | ICD-10-CM | POA: Diagnosis not present

## 2021-09-05 DIAGNOSIS — C184 Malignant neoplasm of transverse colon: Secondary | ICD-10-CM | POA: Diagnosis not present

## 2021-09-05 DIAGNOSIS — J439 Emphysema, unspecified: Secondary | ICD-10-CM | POA: Diagnosis not present

## 2021-09-05 DIAGNOSIS — Z01818 Encounter for other preprocedural examination: Secondary | ICD-10-CM | POA: Diagnosis not present

## 2021-09-05 DIAGNOSIS — F32A Depression, unspecified: Secondary | ICD-10-CM | POA: Diagnosis not present

## 2021-09-11 DIAGNOSIS — Z20822 Contact with and (suspected) exposure to covid-19: Secondary | ICD-10-CM | POA: Diagnosis not present

## 2021-09-11 DIAGNOSIS — C184 Malignant neoplasm of transverse colon: Secondary | ICD-10-CM | POA: Diagnosis not present

## 2021-09-12 DIAGNOSIS — C184 Malignant neoplasm of transverse colon: Secondary | ICD-10-CM | POA: Diagnosis not present

## 2021-09-12 DIAGNOSIS — E278 Other specified disorders of adrenal gland: Secondary | ICD-10-CM | POA: Diagnosis not present

## 2021-09-12 DIAGNOSIS — K219 Gastro-esophageal reflux disease without esophagitis: Secondary | ICD-10-CM | POA: Diagnosis not present

## 2021-09-12 DIAGNOSIS — F3340 Major depressive disorder, recurrent, in remission, unspecified: Secondary | ICD-10-CM | POA: Diagnosis not present

## 2021-09-12 DIAGNOSIS — Z0000 Encounter for general adult medical examination without abnormal findings: Secondary | ICD-10-CM | POA: Diagnosis not present

## 2021-09-12 DIAGNOSIS — E119 Type 2 diabetes mellitus without complications: Secondary | ICD-10-CM | POA: Diagnosis not present

## 2021-09-12 DIAGNOSIS — I700 Atherosclerosis of aorta: Secondary | ICD-10-CM | POA: Diagnosis not present

## 2021-09-13 DIAGNOSIS — Z91048 Other nonmedicinal substance allergy status: Secondary | ICD-10-CM | POA: Diagnosis not present

## 2021-09-13 DIAGNOSIS — E1142 Type 2 diabetes mellitus with diabetic polyneuropathy: Secondary | ICD-10-CM | POA: Diagnosis not present

## 2021-09-13 DIAGNOSIS — G4733 Obstructive sleep apnea (adult) (pediatric): Secondary | ICD-10-CM | POA: Diagnosis not present

## 2021-09-13 DIAGNOSIS — G8918 Other acute postprocedural pain: Secondary | ICD-10-CM | POA: Diagnosis not present

## 2021-09-13 DIAGNOSIS — Z79899 Other long term (current) drug therapy: Secondary | ICD-10-CM | POA: Diagnosis not present

## 2021-09-13 DIAGNOSIS — C184 Malignant neoplasm of transverse colon: Secondary | ICD-10-CM | POA: Diagnosis not present

## 2021-09-13 DIAGNOSIS — Z888 Allergy status to other drugs, medicaments and biological substances status: Secondary | ICD-10-CM | POA: Diagnosis not present

## 2021-09-13 DIAGNOSIS — G4700 Insomnia, unspecified: Secondary | ICD-10-CM | POA: Diagnosis not present

## 2021-09-13 DIAGNOSIS — E785 Hyperlipidemia, unspecified: Secondary | ICD-10-CM | POA: Diagnosis not present

## 2021-09-13 DIAGNOSIS — R109 Unspecified abdominal pain: Secondary | ICD-10-CM | POA: Diagnosis not present

## 2021-09-13 DIAGNOSIS — F329 Major depressive disorder, single episode, unspecified: Secondary | ICD-10-CM | POA: Diagnosis not present

## 2021-09-13 DIAGNOSIS — Z885 Allergy status to narcotic agent status: Secondary | ICD-10-CM | POA: Diagnosis not present

## 2021-09-13 DIAGNOSIS — I10 Essential (primary) hypertension: Secondary | ICD-10-CM | POA: Diagnosis not present

## 2021-09-13 DIAGNOSIS — Z8541 Personal history of malignant neoplasm of cervix uteri: Secondary | ICD-10-CM | POA: Diagnosis not present

## 2021-09-13 DIAGNOSIS — J449 Chronic obstructive pulmonary disease, unspecified: Secondary | ICD-10-CM | POA: Diagnosis not present

## 2021-09-13 DIAGNOSIS — K219 Gastro-esophageal reflux disease without esophagitis: Secondary | ICD-10-CM | POA: Diagnosis not present

## 2021-09-13 DIAGNOSIS — I2510 Atherosclerotic heart disease of native coronary artery without angina pectoris: Secondary | ICD-10-CM | POA: Diagnosis not present

## 2021-09-15 DIAGNOSIS — E876 Hypokalemia: Secondary | ICD-10-CM | POA: Diagnosis not present

## 2021-09-15 DIAGNOSIS — K921 Melena: Secondary | ICD-10-CM | POA: Diagnosis not present

## 2021-09-15 DIAGNOSIS — Z91048 Other nonmedicinal substance allergy status: Secondary | ICD-10-CM | POA: Diagnosis not present

## 2021-09-15 DIAGNOSIS — Z8673 Personal history of transient ischemic attack (TIA), and cerebral infarction without residual deficits: Secondary | ICD-10-CM | POA: Diagnosis not present

## 2021-09-15 DIAGNOSIS — E785 Hyperlipidemia, unspecified: Secondary | ICD-10-CM | POA: Diagnosis not present

## 2021-09-15 DIAGNOSIS — E119 Type 2 diabetes mellitus without complications: Secondary | ICD-10-CM | POA: Diagnosis not present

## 2021-09-15 DIAGNOSIS — F32A Depression, unspecified: Secondary | ICD-10-CM | POA: Diagnosis not present

## 2021-09-15 DIAGNOSIS — Z85038 Personal history of other malignant neoplasm of large intestine: Secondary | ICD-10-CM | POA: Diagnosis not present

## 2021-09-15 DIAGNOSIS — Z90710 Acquired absence of both cervix and uterus: Secondary | ICD-10-CM | POA: Diagnosis not present

## 2021-09-15 DIAGNOSIS — K219 Gastro-esophageal reflux disease without esophagitis: Secondary | ICD-10-CM | POA: Diagnosis not present

## 2021-09-15 DIAGNOSIS — Z20822 Contact with and (suspected) exposure to covid-19: Secondary | ICD-10-CM | POA: Diagnosis not present

## 2021-09-15 DIAGNOSIS — Z888 Allergy status to other drugs, medicaments and biological substances status: Secondary | ICD-10-CM | POA: Diagnosis not present

## 2021-09-15 DIAGNOSIS — Z885 Allergy status to narcotic agent status: Secondary | ICD-10-CM | POA: Diagnosis not present

## 2021-09-15 DIAGNOSIS — I10 Essential (primary) hypertension: Secondary | ICD-10-CM | POA: Diagnosis not present

## 2021-09-15 DIAGNOSIS — Z8541 Personal history of malignant neoplasm of cervix uteri: Secondary | ICD-10-CM | POA: Diagnosis not present

## 2021-09-15 DIAGNOSIS — I2510 Atherosclerotic heart disease of native coronary artery without angina pectoris: Secondary | ICD-10-CM | POA: Diagnosis not present

## 2021-09-15 DIAGNOSIS — J449 Chronic obstructive pulmonary disease, unspecified: Secondary | ICD-10-CM | POA: Diagnosis not present

## 2021-09-15 DIAGNOSIS — G4733 Obstructive sleep apnea (adult) (pediatric): Secondary | ICD-10-CM | POA: Diagnosis not present

## 2021-09-15 DIAGNOSIS — Z9049 Acquired absence of other specified parts of digestive tract: Secondary | ICD-10-CM | POA: Diagnosis not present

## 2021-09-15 DIAGNOSIS — Z87891 Personal history of nicotine dependence: Secondary | ICD-10-CM | POA: Diagnosis not present

## 2021-09-15 DIAGNOSIS — K922 Gastrointestinal hemorrhage, unspecified: Secondary | ICD-10-CM | POA: Diagnosis not present

## 2021-09-25 DIAGNOSIS — C183 Malignant neoplasm of hepatic flexure: Secondary | ICD-10-CM | POA: Diagnosis not present

## 2021-09-25 DIAGNOSIS — Z9889 Other specified postprocedural states: Secondary | ICD-10-CM | POA: Diagnosis not present

## 2021-10-02 DIAGNOSIS — J449 Chronic obstructive pulmonary disease, unspecified: Secondary | ICD-10-CM | POA: Diagnosis not present

## 2021-10-22 DIAGNOSIS — R918 Other nonspecific abnormal finding of lung field: Secondary | ICD-10-CM | POA: Diagnosis not present

## 2021-10-28 ENCOUNTER — Other Ambulatory Visit (HOSPITAL_COMMUNITY): Payer: Self-pay | Admitting: Pulmonary Disease

## 2021-10-28 ENCOUNTER — Other Ambulatory Visit: Payer: Self-pay | Admitting: Pulmonary Disease

## 2021-10-28 DIAGNOSIS — R918 Other nonspecific abnormal finding of lung field: Secondary | ICD-10-CM

## 2021-11-02 DIAGNOSIS — J449 Chronic obstructive pulmonary disease, unspecified: Secondary | ICD-10-CM | POA: Diagnosis not present

## 2021-11-20 DIAGNOSIS — J449 Chronic obstructive pulmonary disease, unspecified: Secondary | ICD-10-CM | POA: Diagnosis not present

## 2021-11-20 DIAGNOSIS — G4733 Obstructive sleep apnea (adult) (pediatric): Secondary | ICD-10-CM | POA: Diagnosis not present

## 2021-12-02 DIAGNOSIS — J449 Chronic obstructive pulmonary disease, unspecified: Secondary | ICD-10-CM | POA: Diagnosis not present

## 2022-01-02 DIAGNOSIS — J449 Chronic obstructive pulmonary disease, unspecified: Secondary | ICD-10-CM | POA: Diagnosis not present

## 2022-02-01 DIAGNOSIS — J449 Chronic obstructive pulmonary disease, unspecified: Secondary | ICD-10-CM | POA: Diagnosis not present

## 2022-02-24 DIAGNOSIS — Z961 Presence of intraocular lens: Secondary | ICD-10-CM | POA: Diagnosis not present

## 2022-03-04 DIAGNOSIS — J449 Chronic obstructive pulmonary disease, unspecified: Secondary | ICD-10-CM | POA: Diagnosis not present

## 2022-03-05 DIAGNOSIS — K219 Gastro-esophageal reflux disease without esophagitis: Secondary | ICD-10-CM | POA: Diagnosis not present

## 2022-03-05 DIAGNOSIS — I700 Atherosclerosis of aorta: Secondary | ICD-10-CM | POA: Diagnosis not present

## 2022-03-05 DIAGNOSIS — F3340 Major depressive disorder, recurrent, in remission, unspecified: Secondary | ICD-10-CM | POA: Diagnosis not present

## 2022-03-05 DIAGNOSIS — R918 Other nonspecific abnormal finding of lung field: Secondary | ICD-10-CM | POA: Diagnosis not present

## 2022-03-05 DIAGNOSIS — C184 Malignant neoplasm of transverse colon: Secondary | ICD-10-CM | POA: Diagnosis not present

## 2022-03-05 DIAGNOSIS — J449 Chronic obstructive pulmonary disease, unspecified: Secondary | ICD-10-CM | POA: Diagnosis not present

## 2022-03-05 DIAGNOSIS — E119 Type 2 diabetes mellitus without complications: Secondary | ICD-10-CM | POA: Diagnosis not present

## 2022-03-06 ENCOUNTER — Other Ambulatory Visit: Payer: Self-pay | Admitting: Pulmonary Disease

## 2022-03-06 DIAGNOSIS — R911 Solitary pulmonary nodule: Secondary | ICD-10-CM

## 2022-03-06 DIAGNOSIS — J449 Chronic obstructive pulmonary disease, unspecified: Secondary | ICD-10-CM

## 2022-03-12 DIAGNOSIS — J449 Chronic obstructive pulmonary disease, unspecified: Secondary | ICD-10-CM | POA: Diagnosis not present

## 2022-03-12 DIAGNOSIS — R7309 Other abnormal glucose: Secondary | ICD-10-CM | POA: Diagnosis not present

## 2022-03-12 DIAGNOSIS — I700 Atherosclerosis of aorta: Secondary | ICD-10-CM | POA: Diagnosis not present

## 2022-03-12 DIAGNOSIS — K219 Gastro-esophageal reflux disease without esophagitis: Secondary | ICD-10-CM | POA: Diagnosis not present

## 2022-03-12 DIAGNOSIS — G5793 Unspecified mononeuropathy of bilateral lower limbs: Secondary | ICD-10-CM | POA: Diagnosis not present

## 2022-03-12 DIAGNOSIS — G4733 Obstructive sleep apnea (adult) (pediatric): Secondary | ICD-10-CM | POA: Diagnosis not present

## 2022-03-12 DIAGNOSIS — C183 Malignant neoplasm of hepatic flexure: Secondary | ICD-10-CM | POA: Diagnosis not present

## 2022-03-12 DIAGNOSIS — D649 Anemia, unspecified: Secondary | ICD-10-CM | POA: Diagnosis not present

## 2022-03-12 DIAGNOSIS — E7800 Pure hypercholesterolemia, unspecified: Secondary | ICD-10-CM | POA: Diagnosis not present

## 2022-03-12 DIAGNOSIS — Z6837 Body mass index (BMI) 37.0-37.9, adult: Secondary | ICD-10-CM | POA: Diagnosis not present

## 2022-03-12 DIAGNOSIS — Z0000 Encounter for general adult medical examination without abnormal findings: Secondary | ICD-10-CM | POA: Diagnosis not present

## 2022-03-19 ENCOUNTER — Ambulatory Visit
Admission: RE | Admit: 2022-03-19 | Discharge: 2022-03-19 | Disposition: A | Discharge status: Home / Self Care | Payer: PPO | Source: Ambulatory Visit | Attending: Pulmonary Disease | Admitting: Pulmonary Disease

## 2022-03-19 DIAGNOSIS — R911 Solitary pulmonary nodule: Secondary | ICD-10-CM

## 2022-03-19 DIAGNOSIS — R918 Other nonspecific abnormal finding of lung field: Secondary | ICD-10-CM | POA: Diagnosis not present

## 2022-03-19 DIAGNOSIS — J449 Chronic obstructive pulmonary disease, unspecified: Secondary | ICD-10-CM

## 2022-03-19 DIAGNOSIS — I700 Atherosclerosis of aorta: Secondary | ICD-10-CM | POA: Diagnosis not present

## 2022-03-19 DIAGNOSIS — J439 Emphysema, unspecified: Secondary | ICD-10-CM | POA: Diagnosis not present

## 2022-03-26 DIAGNOSIS — C3411 Malignant neoplasm of upper lobe, right bronchus or lung: Secondary | ICD-10-CM | POA: Diagnosis not present

## 2022-03-27 ENCOUNTER — Other Ambulatory Visit: Payer: Self-pay | Admitting: Pulmonary Disease

## 2022-03-27 DIAGNOSIS — C3411 Malignant neoplasm of upper lobe, right bronchus or lung: Secondary | ICD-10-CM

## 2022-03-28 ENCOUNTER — Ambulatory Visit (HOSPITAL_COMMUNITY): Payer: PPO

## 2022-03-28 ENCOUNTER — Encounter (HOSPITAL_COMMUNITY): Payer: Self-pay

## 2022-04-04 DIAGNOSIS — J449 Chronic obstructive pulmonary disease, unspecified: Secondary | ICD-10-CM | POA: Diagnosis not present

## 2022-04-10 ENCOUNTER — Ambulatory Visit: Payer: PPO

## 2022-04-10 ENCOUNTER — Ambulatory Visit
Admission: RE | Admit: 2022-04-10 | Discharge: 2022-04-10 | Disposition: A | Discharge status: Home / Self Care | Payer: PPO | Source: Ambulatory Visit | Attending: Pulmonary Disease | Admitting: Pulmonary Disease

## 2022-04-10 DIAGNOSIS — C3411 Malignant neoplasm of upper lobe, right bronchus or lung: Secondary | ICD-10-CM | POA: Diagnosis not present

## 2022-04-10 DIAGNOSIS — I2510 Atherosclerotic heart disease of native coronary artery without angina pectoris: Secondary | ICD-10-CM | POA: Diagnosis not present

## 2022-04-10 DIAGNOSIS — J432 Centrilobular emphysema: Secondary | ICD-10-CM | POA: Diagnosis not present

## 2022-04-10 DIAGNOSIS — D3500 Benign neoplasm of unspecified adrenal gland: Secondary | ICD-10-CM | POA: Diagnosis not present

## 2022-04-10 LAB — GLUCOSE, CAPILLARY: Glucose-Capillary: 121 mg/dL — ABNORMAL HIGH (ref 70–99)

## 2022-04-10 MED ORDER — FLUDEOXYGLUCOSE F - 18 (FDG) INJECTION
9.3000 | Freq: Once | INTRAVENOUS | Status: AC | PRN
Start: 2022-04-10 — End: 2022-04-10
  Administered 2022-04-10: 9.9 via INTRAVENOUS

## 2022-04-14 ENCOUNTER — Ambulatory Visit
Admission: RE | Admit: 2022-04-14 | Discharge: 2022-04-14 | Disposition: A | Discharge status: Home / Self Care | Payer: PPO | Source: Ambulatory Visit | Attending: Radiation Oncology | Admitting: Radiation Oncology

## 2022-04-14 ENCOUNTER — Encounter: Payer: Self-pay | Admitting: Radiation Oncology

## 2022-04-14 VITALS — BP 129/82 | HR 72 | Temp 97.2°F | Resp 12 | Ht 59.0 in | Wt 179.0 lb

## 2022-04-14 DIAGNOSIS — R918 Other nonspecific abnormal finding of lung field: Secondary | ICD-10-CM | POA: Diagnosis not present

## 2022-04-14 DIAGNOSIS — E785 Hyperlipidemia, unspecified: Secondary | ICD-10-CM | POA: Diagnosis not present

## 2022-04-14 DIAGNOSIS — Z791 Long term (current) use of non-steroidal anti-inflammatories (NSAID): Secondary | ICD-10-CM | POA: Insufficient documentation

## 2022-04-14 DIAGNOSIS — K219 Gastro-esophageal reflux disease without esophagitis: Secondary | ICD-10-CM | POA: Insufficient documentation

## 2022-04-14 DIAGNOSIS — Z79899 Other long term (current) drug therapy: Secondary | ICD-10-CM | POA: Insufficient documentation

## 2022-04-14 DIAGNOSIS — Z87891 Personal history of nicotine dependence: Secondary | ICD-10-CM | POA: Diagnosis not present

## 2022-04-14 DIAGNOSIS — C3411 Malignant neoplasm of upper lobe, right bronchus or lung: Secondary | ICD-10-CM | POA: Diagnosis not present

## 2022-04-14 DIAGNOSIS — G4730 Sleep apnea, unspecified: Secondary | ICD-10-CM | POA: Insufficient documentation

## 2022-04-14 DIAGNOSIS — I10 Essential (primary) hypertension: Secondary | ICD-10-CM | POA: Diagnosis not present

## 2022-04-14 DIAGNOSIS — J449 Chronic obstructive pulmonary disease, unspecified: Secondary | ICD-10-CM | POA: Insufficient documentation

## 2022-04-14 NOTE — Consult Note (Signed)
NEW PATIENT EVALUATION  Name: Lisa Cowan  MRN: [id number]  Date:   04/14/2022     DOB: Nov 27, 1940   This 81 y.o. 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  REFERRING PHYSICIAN: Tracie Harrier, MD  CHIEF COMPLAINT:  Chief Complaint  Patient presents with   lung nodules    DIAGNOSIS: The encounter diagnosis was Pulmonary nodules.   PREVIOUS INVESTIGATIONS:  CT scans and PET CT scans reviewed Clinical notes reviewed Pathology reports reviewed  HPI: Patient is a 81 year old female followed for right upper lobe spiculated nodule.  This is been followed on serial CT scans showing increasing enlargement and a spiculated nodule.  PET CT scan demonstrated hypermetabolic activity in the right upper lobe nodule suggestive of primary bronchogenic carcinoma.  She also 2 other nodules noted in the right middle lobes both not hypermetabolic.  No other evidence of metastatic disease or mediastinal adenopathy was noted.  Patient had navigational bronchoscopy back in April which was nondiagnostic and resulted in pneumothorax.  She needed a chest tube.  She has significant COPD emphysema.  I been asked to evaluate this patient for possible SBRT treatment.  She is asymptomatic specifically denies cough hemoptysis or chest tightness.  PLANNED TREATMENT REGIMEN: SBRT  PAST MEDICAL HISTORY:  has a past medical history of Adrenal nodule ([place]), Anemia, Anginal pain ([place]), Arthritis, Cancer ([place]), Complication of anesthesia, COPD (chronic obstructive pulmonary disease) ([place]), Depression, Dyspnea, GERD (gastroesophageal reflux disease), Headache, Hyperlipidemia, Hypertension, PONV (postoperative nausea and vomiting), Skin cancer, and Sleep apnea.    PAST SURGICAL HISTORY:  Past Surgical History:  Procedure Laterality Date   ABDOMINAL HYSTERECTOMY     ADENOIDECTOMY     BREAST BIOPSY Right 01/04/2016   stereo   CARDIAC  CATHETERIZATION Left 08/21/2016   Procedure: Left Heart Cath and Coronary Angiography;  Surgeon: Isaias Cowman, MD;  Location: [facility];  Service: Cardiovascular;  Laterality: Left;   CATARACT EXTRACTION W/PHACO Right 05/18/2019   Procedure: CATARACT EXTRACTION PHACO AND INTRAOCULAR LENS PLACEMENT (IOC) Right  00:52.2  18.7%  9.59;  Surgeon: Leandrew Koyanagi, MD;  Location: [facility];  Service: Ophthalmology;  Laterality: Right;  sleep apnea   CATARACT EXTRACTION W/PHACO Left 06/08/2019   Procedure: CATARACT EXTRACTION PHACO AND INTRAOCULAR LENS PLACEMENT (IOC) LEFT 9.60,    01:08.5,    14.0%;  Surgeon: Leandrew Koyanagi, MD;  Location: [facility];  Service: Ophthalmology;  Laterality: Left;   COLONOSCOPY N/A 05/03/2021   Procedure: COLONOSCOPY;  Surgeon: Annamaria Helling, DO;  Location: [facility] END[facility]COPY;  Service: Gastroenterology;  Laterality: N/A;   COLONOSCOPY WITH PROPOFOL N/A 08/07/2017   Procedure: COLONOSCOPY WITH PROPOFOL;  Surgeon: Manya Silvas, MD;  Location: [facility] ENDOSCOPY;  Service: Endoscopy;  Laterality: N/A;   ESOPHAGOGASTRODUODENOSCOPY (EGD) WITH PROPOFOL N/A 08/07/2017   Procedure: ESOPHAGOGASTRODUODENOSCOPY (EGD) WITH PROPOFOL;  Surgeon: Manya Silvas, MD;  Location: [facility] ENDOSCOPY;  Service: Endoscopy;  Laterality: N/A;   ESOPHAGOGASTRODUODENOSCOPY (EGD) WITH PROPOFOL N/A 05/03/2021   Procedure: ESOPHAGOGASTRODUODENOSCOPY (EGD) WITH PROPOFOL;  Surgeon: Annamaria Helling, DO;  Location: [facility] ENDOSCOPY;  Service: Gastroenterology;  Laterality: N/A;   EYE SURGERY     FOOT SURGERY     multiple   FRACTURE SURGERY     great toe fx   ROTATOR CUFF REPAIR Left    TONSILLECTOMY     VIDEO BRONCHOSCOPY WITH ENDOBRONCHIAL NAVIGATION N/A 12/05/2019   Procedure: VIDEO BRONCHOSCOPY WITH ENDOBRONCHIAL NAVIGATION;  Surgeon: Ottie Glazier, MD;  Location: [facility];  Service: Thoracic;  Laterality: N/A;   VIDEO BRONCHOSCOPY WITH  ENDOBRONCHIAL NAVIGATION N/A 11/14/2020   Procedure: VIDEO BRONCHOSCOPY WITH ENDOBRONCHIAL NAVIGATION AND CELLVIZIO;  Surgeon: Tyler Pita, MD;  Location: [facility];  Service: Pulmonary;  Laterality: N/A;   VIDEO BRONCHOSCOPY WITH ENDOBRONCHIAL ULTRASOUND N/A 12/05/2019   Procedure: VIDEO BRONCHOSCOPY WITH ENDOBRONCHIAL ULTRASOUND;  Surgeon: Ottie Glazier, MD;  Location: [facility];  Service: Thoracic;  Laterality: N/A;    FAMILY HISTORY: family history includes Breast cancer (age of onset: 60) in her sister.  SOCIAL HISTORY:  reports that she quit smoking about 23 years ago. Her smoking use included cigarettes. She has a 30.00 pack-year smoking history. She has never used smokeless tobacco. She reports current alcohol use. She reports that she does not use drugs.  ALLERGIES: Codeine, Hydrocodone-acetaminophen, Nucynta [tapentadol], Oxycodone-acetaminophen, Adhesive [tape], and Statins  MEDICATIONS:  Current Outpatient Medications  Medication Sig Dispense Refill   albuterol (VENTOLIN HFA) 108 (90 Base) MCG/ACT inhaler Inhale 1-2 puffs into the lungs every 6 (six) hours as needed for wheezing or shortness of breath.     atenolol (TENORMIN) 25 MG tablet Take 25 mg by mouth every morning.      Cyanocobalamin (VITAMIN B-12) 5000 MCG TBDP Take 5,000 mcg by mouth daily.     FLUoxetine (PROZAC) 20 MG capsule Take 20 mg by mouth every morning.      gabapentin (NEURONTIN) 300 MG capsule Take 300 mg by mouth. 2-3 times per day     ipratropium (ATROVENT) 0.03 % nasal spray Place 2 sprays into both nostrils daily as needed.     meclizine (ANTIVERT) 12.5 MG tablet Take 1 tablet (12.5 mg total) by mouth 2 (two) times daily as needed for dizziness. (Patient taking differently: Take 12.5 mg by mouth daily as needed for dizziness.) 15 tablet 0   meloxicam (MOBIC) 7.5 MG tablet Take 7.5 mg by mouth daily.     methocarbamol (ROBAXIN) 500 MG tablet Take 500 mg by mouth 3 (three) times daily as needed for  muscle spasms.     Multiple Vitamin (MULTIVITAMIN WITH MINERALS) TABS tablet Take 1 tablet by mouth every other day.     Multiple Vitamins-Minerals (HAIR/SKIN/NAILS) TABS Take 1 tablet by mouth daily.     omeprazole (PRILOSEC) 20 MG capsule Take 20 mg by mouth 2 (two) times daily before a meal.     rosuvastatin (CRESTOR) 5 MG tablet Take 5 mg by mouth every Monday, Wednesday, and Friday.   11   SYSTANE ULTRA 0.4-0.3 % SOLN Place 2 drops into both eyes daily.     tiotropium (SPIRIVA) 18 MCG inhalation capsule Place 18 mcg into inhaler and inhale daily.     triamterene-hydrochlorothiazide (DYAZIDE) 37.5-25 MG capsule Take 1 capsule by mouth every morning.      No current facility-administered medications for this encounter.    ECOG PERFORMANCE STATUS:  0 - Asymptomatic  REVIEW OF SYSTEMS: Patient denies any weight loss, fatigue, weakness, fever, chills or night sweats. Patient denies any loss of vision, blurred vision. Patient denies any ringing  of the ears or hearing loss. No irregular heartbeat. Patient denies heart murmur or history of fainting. Patient denies any chest pain or pain radiating to her upper extremities. Patient denies any shortness of breath, difficulty breathing at night, cough or hemoptysis. Patient denies any swelling in the lower legs. Patient denies any nausea vomiting, vomiting of blood, or coffee ground material in the vomitus. Patient denies  any stomach pain. Patient states has had normal bowel movements no significant constipation or diarrhea. Patient denies any dysuria, hematuria or significant nocturia. Patient denies any problems walking, swelling in the joints or loss of balance. Patient denies any skin changes, loss of hair or loss of weight. Patient denies any excessive worrying or anxiety or significant depression. Patient denies any problems with insomnia. Patient denies excessive thirst, polyuria, polydipsia. Patient denies any swollen glands, patient denies easy  bruising or easy bleeding. Patient denies any recent infections, allergies or URI. Patient "s visual fields have not changed significantly in recent time.   PHYSICAL EXAM: BP 129/82 (BP Location: Left Arm, Patient Position: Sitting, Cuff Size: Large)   Pulse 72   Temp (!) 97.2 F (36.2 C) (Tympanic)   Resp 12   Ht 4\' 11"  (1.499 m)   Wt 179 lb (81.2 kg)   BMI 36.15 kg/m  Well-developed well-nourished patient in NAD. HEENT reveals PERLA, EOMI, discs not visualized.  Oral cavity is clear. No oral mucosal lesions are identified. Neck is clear without evidence of cervical or supraclavicular adenopathy. Lungs are clear to A&P. Cardiac examination is essentially unremarkable with regular rate and rhythm without murmur rub or thrill. Abdomen is benign with no organomegaly or masses noted. Motor sensory and DTR levels are equal and symmetric in the upper and lower extremities. Cranial nerves II through XII are grossly intact. Proprioception is intact. No peripheral adenopathy or edema is identified. No motor or sensory levels are noted. Crude visual fields are within normal range.     LABORATORY DATA: Previous pathology reports reviewed    RADIOLOGY RESULTS: CT scans and PET CT scans reviewed compatible with above-stated findings   IMPRESSION: Progressive stage I non-small cell lung cancer of the right upper lobe in 81 year old female  PLAN: This time of recommended SBRT to a right upper lobe.  We will plan on delivering 60 Gray in 5 fractions.  Use motion restriction for dimensional treatment planning during simulation.  Risks and benefits of treatment including extremely low side effect profile possible possible developing a cough and architectural distortion of her lung all were discussed in detail with the patient and her granddaughter.  They both seem to comprehend the recommendations well if personally set up and ordered CT simulation for first thing next week.  I would like to take this  opportunity to thank you for allowing me to participate in the care of your patient.Noreene Filbert, MD

## 2022-04-21 ENCOUNTER — Ambulatory Visit
Admission: RE | Admit: 2022-04-21 | Discharge: 2022-04-21 | Disposition: A | Discharge status: Home / Self Care | Payer: PPO | Source: Ambulatory Visit | Attending: Radiation Oncology | Admitting: Radiation Oncology

## 2022-04-21 DIAGNOSIS — C3411 Malignant neoplasm of upper lobe, right bronchus or lung: Secondary | ICD-10-CM | POA: Diagnosis not present

## 2022-04-21 DIAGNOSIS — Z510 Encounter for antineoplastic radiation therapy: Secondary | ICD-10-CM | POA: Insufficient documentation

## 2022-04-21 DIAGNOSIS — Z87891 Personal history of nicotine dependence: Secondary | ICD-10-CM | POA: Diagnosis not present

## 2022-04-29 DIAGNOSIS — G4733 Obstructive sleep apnea (adult) (pediatric): Secondary | ICD-10-CM | POA: Diagnosis not present

## 2022-04-29 DIAGNOSIS — Z510 Encounter for antineoplastic radiation therapy: Secondary | ICD-10-CM | POA: Diagnosis not present

## 2022-04-29 DIAGNOSIS — J449 Chronic obstructive pulmonary disease, unspecified: Secondary | ICD-10-CM | POA: Diagnosis not present

## 2022-04-29 DIAGNOSIS — Z87891 Personal history of nicotine dependence: Secondary | ICD-10-CM | POA: Diagnosis not present

## 2022-04-29 DIAGNOSIS — C3411 Malignant neoplasm of upper lobe, right bronchus or lung: Secondary | ICD-10-CM | POA: Diagnosis not present

## 2022-05-01 ENCOUNTER — Other Ambulatory Visit: Payer: Self-pay

## 2022-05-01 ENCOUNTER — Ambulatory Visit
Admission: RE | Admit: 2022-05-01 | Discharge: 2022-05-01 | Disposition: A | Discharge status: Home / Self Care | Payer: PPO | Source: Ambulatory Visit | Attending: Radiation Oncology | Admitting: Radiation Oncology

## 2022-05-01 DIAGNOSIS — Z510 Encounter for antineoplastic radiation therapy: Secondary | ICD-10-CM | POA: Diagnosis not present

## 2022-05-01 LAB — RAD ONC ARIA SESSION SUMMARY
Course Elapsed Days: 0
Plan Fractions Treated to Date: 1
Plan Prescribed Dose Per Fraction: 12 Gy
Plan Total Fractions Prescribed: 5
Plan Total Prescribed Dose: 60 Gy
Reference Point Dosage Given to Date: 12 Gy
Reference Point Session Dosage Given: 12 Gy
Session Number: 1

## 2022-05-04 DIAGNOSIS — J449 Chronic obstructive pulmonary disease, unspecified: Secondary | ICD-10-CM | POA: Diagnosis not present

## 2022-05-06 ENCOUNTER — Ambulatory Visit
Admission: RE | Admit: 2022-05-06 | Discharge: 2022-05-06 | Disposition: A | Discharge status: Home / Self Care | Payer: PPO | Source: Ambulatory Visit | Attending: Radiation Oncology | Admitting: Radiation Oncology

## 2022-05-06 ENCOUNTER — Other Ambulatory Visit: Payer: Self-pay

## 2022-05-06 DIAGNOSIS — Z510 Encounter for antineoplastic radiation therapy: Secondary | ICD-10-CM | POA: Diagnosis not present

## 2022-05-06 LAB — RAD ONC ARIA SESSION SUMMARY
Course Elapsed Days: 5
Plan Fractions Treated to Date: 2
Plan Prescribed Dose Per Fraction: 12 Gy
Plan Total Fractions Prescribed: 5
Plan Total Prescribed Dose: 60 Gy
Reference Point Dosage Given to Date: 24 Gy
Reference Point Session Dosage Given: 12 Gy
Session Number: 2

## 2022-05-08 ENCOUNTER — Other Ambulatory Visit: Payer: Self-pay

## 2022-05-08 ENCOUNTER — Ambulatory Visit
Admission: RE | Admit: 2022-05-08 | Discharge: 2022-05-08 | Disposition: A | Discharge status: Home / Self Care | Payer: PPO | Source: Ambulatory Visit | Attending: Radiation Oncology | Admitting: Radiation Oncology

## 2022-05-08 DIAGNOSIS — Z510 Encounter for antineoplastic radiation therapy: Secondary | ICD-10-CM | POA: Diagnosis not present

## 2022-05-08 LAB — RAD ONC ARIA SESSION SUMMARY
Course Elapsed Days: 7
Plan Fractions Treated to Date: 3
Plan Prescribed Dose Per Fraction: 12 Gy
Plan Total Fractions Prescribed: 5
Plan Total Prescribed Dose: 60 Gy
Reference Point Dosage Given to Date: 36 Gy
Reference Point Session Dosage Given: 12 Gy
Session Number: 3

## 2022-05-12 ENCOUNTER — Other Ambulatory Visit: Payer: Self-pay

## 2022-05-12 ENCOUNTER — Ambulatory Visit
Admission: RE | Admit: 2022-05-12 | Discharge: 2022-05-12 | Disposition: A | Discharge status: Home / Self Care | Payer: PPO | Source: Ambulatory Visit | Attending: Radiation Oncology | Admitting: Radiation Oncology

## 2022-05-12 DIAGNOSIS — Z510 Encounter for antineoplastic radiation therapy: Secondary | ICD-10-CM | POA: Diagnosis not present

## 2022-05-12 LAB — RAD ONC ARIA SESSION SUMMARY
Course Elapsed Days: 11
Plan Fractions Treated to Date: 4
Plan Prescribed Dose Per Fraction: 12 Gy
Plan Total Fractions Prescribed: 5
Plan Total Prescribed Dose: 60 Gy
Reference Point Dosage Given to Date: 48 Gy
Reference Point Session Dosage Given: 12 Gy
Session Number: 4

## 2022-05-14 ENCOUNTER — Ambulatory Visit
Admission: RE | Admit: 2022-05-14 | Discharge: 2022-05-14 | Disposition: A | Discharge status: Home / Self Care | Payer: PPO | Source: Ambulatory Visit | Attending: Radiation Oncology | Admitting: Radiation Oncology

## 2022-05-14 ENCOUNTER — Other Ambulatory Visit: Payer: Self-pay

## 2022-05-14 DIAGNOSIS — C3411 Malignant neoplasm of upper lobe, right bronchus or lung: Secondary | ICD-10-CM | POA: Diagnosis not present

## 2022-05-14 DIAGNOSIS — Z87891 Personal history of nicotine dependence: Secondary | ICD-10-CM | POA: Diagnosis not present

## 2022-05-14 DIAGNOSIS — Z510 Encounter for antineoplastic radiation therapy: Secondary | ICD-10-CM | POA: Diagnosis not present

## 2022-05-14 LAB — RAD ONC ARIA SESSION SUMMARY
Course Elapsed Days: 13
Plan Fractions Treated to Date: 5
Plan Prescribed Dose Per Fraction: 12 Gy
Plan Total Fractions Prescribed: 5
Plan Total Prescribed Dose: 60 Gy
Reference Point Dosage Given to Date: 60 Gy
Reference Point Session Dosage Given: 12 Gy
Session Number: 5

## 2022-05-23 ENCOUNTER — Telehealth: Payer: Self-pay | Admitting: *Deleted

## 2022-05-23 NOTE — Patient Outreach (Signed)
  Care Coordination   Initial Visit Note   05/23/2022 Name: Lisa Cowan MRN: [id number] DOB: 1941/02/03  Lisa Cowan is a 81 y.o. year old female who sees Tracie Harrier, MD for primary care. I spoke with  Eloisa Northern by phone today.  What matters to the patients health and wellness today?  Does not agree to services, but does agree to receive program information in mail. Will call if she has questions/needs.    SDOH assessments and interventions completed:  No     Care Coordination Interventions Activated:  Yes  Care Coordination Interventions:  Yes, provided   Follow up plan: No further intervention required.   Encounter Outcome:  Pt. Visit Completed   Valente David, RN, MSN, [facility] Care Management Coordinator [phone]

## 2022-06-04 DIAGNOSIS — J449 Chronic obstructive pulmonary disease, unspecified: Secondary | ICD-10-CM | POA: Diagnosis not present

## 2022-06-19 ENCOUNTER — Other Ambulatory Visit: Payer: Self-pay | Admitting: *Deleted

## 2022-06-19 ENCOUNTER — Encounter: Payer: Self-pay | Admitting: Radiation Oncology

## 2022-06-19 ENCOUNTER — Ambulatory Visit
Admission: RE | Admit: 2022-06-19 | Discharge: 2022-06-19 | Disposition: A | Discharge status: Home / Self Care | Payer: PPO | Source: Ambulatory Visit | Attending: Radiation Oncology | Admitting: Radiation Oncology

## 2022-06-19 VITALS — BP 138/55 | HR 64 | Resp 16 | Ht 59.0 in | Wt 186.0 lb

## 2022-06-19 DIAGNOSIS — R918 Other nonspecific abnormal finding of lung field: Secondary | ICD-10-CM

## 2022-06-19 DIAGNOSIS — C3411 Malignant neoplasm of upper lobe, right bronchus or lung: Secondary | ICD-10-CM | POA: Insufficient documentation

## 2022-06-19 NOTE — Progress Notes (Signed)
Radiation Oncology Follow up Note  Name: Lisa Cowan   Date:   06/19/2022 MRN:  [id number] DOB: [birth year]    This 81 y.o. female presents to the clinic today for 1 month follow-up status post SBRT to right upper lobe for presumed non-small cell lung cancer stage I.  REFERRING PROVIDER: Tracie Harrier, MD  HPI: Patient is an 81 year old female now out 1 month having completed SBRT to her right upper lobe for presumed non-small cell lung cancer stage I.  Seen today in routine follow-up she is doing well she has somewhat of a slight productive cough.  No hemoptysis chest tightness or any change in pulmonary status.  She is having no dysphagia..  COMPLICATIONS OF TREATMENT: none  FOLLOW UP COMPLIANCE: keeps appointments   PHYSICAL EXAM:  BP (!) 138/55   Pulse 64   Resp 16   Ht 4\' 11"  (1.499 m)   Wt 186 lb (84.4 kg)   BMI 37.57 kg/m  Well-developed well-nourished patient in NAD. HEENT reveals PERLA, EOMI, discs not visualized.  Oral cavity is clear. No oral mucosal lesions are identified. Neck is clear without evidence of cervical or supraclavicular adenopathy. Lungs are clear to A&P. Cardiac examination is essentially unremarkable with regular rate and rhythm without murmur rub or thrill. Abdomen is benign with no organomegaly or masses noted. Motor sensory and DTR levels are equal and symmetric in the upper and lower extremities. Cranial nerves II through XII are grossly intact. Proprioception is intact. No peripheral adenopathy or edema is identified. No motor or sensory levels are noted. Crude visual fields are within normal range.  RADIOLOGY RESULTS: CT scan ordered in 3 months  PLAN: Present time patient is doing well.  Very low side effect profile.  I have explained to her the cough is most likely due to response to radiation therapy.  I have asked to see her back in 3 months with a repeat CT scan of her chest.  Patient comprehends my recommendations well.  I would like to  take this opportunity to thank you for allowing me to participate in the care of your patient.Noreene Filbert, MD

## 2022-07-04 DIAGNOSIS — J449 Chronic obstructive pulmonary disease, unspecified: Secondary | ICD-10-CM | POA: Diagnosis not present

## 2022-09-04 DIAGNOSIS — D649 Anemia, unspecified: Secondary | ICD-10-CM | POA: Diagnosis not present

## 2022-09-04 DIAGNOSIS — Z79899 Other long term (current) drug therapy: Secondary | ICD-10-CM | POA: Diagnosis not present

## 2022-09-04 DIAGNOSIS — D6489 Other specified anemias: Secondary | ICD-10-CM | POA: Diagnosis not present

## 2022-09-04 DIAGNOSIS — C183 Malignant neoplasm of hepatic flexure: Secondary | ICD-10-CM | POA: Diagnosis not present

## 2022-09-04 DIAGNOSIS — R7309 Other abnormal glucose: Secondary | ICD-10-CM | POA: Diagnosis not present

## 2022-09-04 DIAGNOSIS — J449 Chronic obstructive pulmonary disease, unspecified: Secondary | ICD-10-CM | POA: Diagnosis not present

## 2022-09-04 DIAGNOSIS — E7800 Pure hypercholesterolemia, unspecified: Secondary | ICD-10-CM | POA: Diagnosis not present

## 2022-09-04 DIAGNOSIS — I700 Atherosclerosis of aorta: Secondary | ICD-10-CM | POA: Diagnosis not present

## 2022-09-04 DIAGNOSIS — G5793 Unspecified mononeuropathy of bilateral lower limbs: Secondary | ICD-10-CM | POA: Diagnosis not present

## 2022-09-04 DIAGNOSIS — Z0000 Encounter for general adult medical examination without abnormal findings: Secondary | ICD-10-CM | POA: Diagnosis not present

## 2022-09-04 DIAGNOSIS — K219 Gastro-esophageal reflux disease without esophagitis: Secondary | ICD-10-CM | POA: Diagnosis not present

## 2022-09-04 DIAGNOSIS — G4733 Obstructive sleep apnea (adult) (pediatric): Secondary | ICD-10-CM | POA: Diagnosis not present

## 2022-09-04 DIAGNOSIS — Z6837 Body mass index (BMI) 37.0-37.9, adult: Secondary | ICD-10-CM | POA: Diagnosis not present

## 2022-09-11 DIAGNOSIS — I700 Atherosclerosis of aorta: Secondary | ICD-10-CM | POA: Diagnosis not present

## 2022-09-11 DIAGNOSIS — C184 Malignant neoplasm of transverse colon: Secondary | ICD-10-CM | POA: Diagnosis not present

## 2022-09-11 DIAGNOSIS — I10 Essential (primary) hypertension: Secondary | ICD-10-CM | POA: Diagnosis not present

## 2022-09-11 DIAGNOSIS — C3411 Malignant neoplasm of upper lobe, right bronchus or lung: Secondary | ICD-10-CM | POA: Diagnosis not present

## 2022-09-11 DIAGNOSIS — E119 Type 2 diabetes mellitus without complications: Secondary | ICD-10-CM | POA: Diagnosis not present

## 2022-09-11 DIAGNOSIS — J449 Chronic obstructive pulmonary disease, unspecified: Secondary | ICD-10-CM | POA: Diagnosis not present

## 2022-09-11 DIAGNOSIS — E278 Other specified disorders of adrenal gland: Secondary | ICD-10-CM | POA: Diagnosis not present

## 2022-09-11 DIAGNOSIS — Z0000 Encounter for general adult medical examination without abnormal findings: Secondary | ICD-10-CM | POA: Diagnosis not present

## 2022-09-11 DIAGNOSIS — F3340 Major depressive disorder, recurrent, in remission, unspecified: Secondary | ICD-10-CM | POA: Diagnosis not present

## 2022-09-15 ENCOUNTER — Other Ambulatory Visit: Payer: PPO

## 2022-09-15 ENCOUNTER — Ambulatory Visit
Admission: RE | Admit: 2022-09-15 | Discharge: 2022-09-15 | Disposition: A | Discharge status: Home / Self Care | Payer: PPO | Source: Ambulatory Visit | Attending: Radiation Oncology | Admitting: Radiation Oncology

## 2022-09-15 DIAGNOSIS — R918 Other nonspecific abnormal finding of lung field: Secondary | ICD-10-CM | POA: Insufficient documentation

## 2022-09-15 DIAGNOSIS — C3490 Malignant neoplasm of unspecified part of unspecified bronchus or lung: Secondary | ICD-10-CM | POA: Diagnosis not present

## 2022-09-15 DIAGNOSIS — J439 Emphysema, unspecified: Secondary | ICD-10-CM | POA: Diagnosis not present

## 2022-09-15 MED ORDER — IOHEXOL 300 MG/ML  SOLN
75.0000 mL | Freq: Once | INTRAMUSCULAR | Status: AC | PRN
  Administered 2022-09-15: 75 mL via INTRAVENOUS

## 2022-09-22 ENCOUNTER — Encounter: Payer: Self-pay | Admitting: Radiation Oncology

## 2022-09-22 ENCOUNTER — Ambulatory Visit
Admission: RE | Admit: 2022-09-22 | Discharge: 2022-09-22 | Disposition: A | Discharge status: Home / Self Care | Payer: PPO | Source: Ambulatory Visit | Attending: Radiation Oncology | Admitting: Radiation Oncology

## 2022-09-22 VITALS — BP 131/77 | HR 69 | Temp 95.0°F | Resp 16 | Ht 59.0 in | Wt 187.8 lb

## 2022-09-22 DIAGNOSIS — C3411 Malignant neoplasm of upper lobe, right bronchus or lung: Secondary | ICD-10-CM | POA: Insufficient documentation

## 2022-09-22 DIAGNOSIS — Z923 Personal history of irradiation: Secondary | ICD-10-CM | POA: Insufficient documentation

## 2022-09-22 DIAGNOSIS — Z87891 Personal history of nicotine dependence: Secondary | ICD-10-CM | POA: Diagnosis not present

## 2022-09-22 NOTE — Progress Notes (Signed)
Radiation Oncology Follow up Note  Name: Lisa Cowan   Date:   09/22/2022 MRN:  [id number] DOB: 05-04-1941    This 82 y.o. female presents to the clinic today for 71-monthfollow-up status post SBRT to right upper lobe for presumed stage I non-small cell lung cancer..Marland Kitchen REFERRING PROVIDER: HTracie Harrier MD  HPI: Patient is a 82year old female now out for months include SBRT to her right upper lobe for presumed stage I non-small cell lung cancer stage I.  Seen today in routine follow-up he is doing well she occasionally gets a little short of breath.  No hemoptysis cough.  She had a recent CT scan which I reviewed showing increased size of spiculated upper lobe nodule showing tenting of the right pleural surface representing response to therapy.  She is also some other pulmonary nodules 1 in the major fissure of the right middle lobe which remained suspicious although had no metabolic activity on recent PET scan.  COMPLICATIONS OF TREATMENT: none  FOLLOW UP COMPLIANCE: keeps appointments   PHYSICAL EXAM:  BP 131/77   Pulse 69   Temp (!) 95 F (35 C)   Resp 16   Ht '4\' 11"'$  (1.499 m)   Wt 187 lb 12.8 oz (85.2 kg)   BMI 37.93 kg/m  Well-developed well-nourished patient in NAD. HEENT reveals PERLA, EOMI, discs not visualized.  Oral cavity is clear. No oral mucosal lesions are identified. Neck is clear without evidence of cervical or supraclavicular adenopathy. Lungs are clear to A&P. Cardiac examination is essentially unremarkable with regular rate and rhythm without murmur rub or thrill. Abdomen is benign with no organomegaly or masses noted. Motor sensory and DTR levels are equal and symmetric in the upper and lower extremities. Cranial nerves II through XII are grossly intact. Proprioception is intact. No peripheral adenopathy or edema is identified. No motor or sensory levels are noted. Crude visual fields are within normal range.  RADIOLOGY RESULTS: CT scan and PET scan reviewed  compatible with above-stated findings  PLAN: At this time I have ordered a follow-up in 5 months with a repeat CT scan of the chest.  Would like to keep an eye on the other 2 nodules that are PET negative at this time.  Patient comprehends my plan well if everything is stable at 5 months with another 6 months with repeat CT scan.  Patient comprehends her recommendations well.  I would like to take this opportunity to thank you for allowing me to participate in the care of your patient..Noreene Filbert MD
